# Patient Record
Sex: Female | Born: 1960
Health system: Southern US, Community
[De-identification: ages and names within clinical notes are randomized; demographics above are authoritative.]

## PROBLEM LIST (undated history)

## (undated) ENCOUNTER — Emergency Department (HOSPITAL_COMMUNITY): Payer: 59 | Source: Home / Self Care

## (undated) DIAGNOSIS — G43909 Migraine, unspecified, not intractable, without status migrainosus: Secondary | ICD-10-CM

## (undated) DIAGNOSIS — N959 Unspecified menopausal and perimenopausal disorder: Secondary | ICD-10-CM

## (undated) DIAGNOSIS — T7840XA Allergy, unspecified, initial encounter: Secondary | ICD-10-CM

## (undated) DIAGNOSIS — C50919 Malignant neoplasm of unspecified site of unspecified female breast: Secondary | ICD-10-CM

## (undated) HISTORY — PX: MASTECTOMY: SHX3

## (undated) HISTORY — DX: Allergy, unspecified, initial encounter: T78.40XA

## (undated) HISTORY — PX: OTHER SURGICAL HISTORY: SHX169

## (undated) HISTORY — PX: BREAST SURGERY: SHX581

---

## 1999-01-22 ENCOUNTER — Other Ambulatory Visit: Admission: RE | Admit: 1999-01-22 | Discharge: 1999-01-22 | Payer: Self-pay | Admitting: *Deleted

## 1999-10-26 ENCOUNTER — Ambulatory Visit (HOSPITAL_COMMUNITY): Admission: RE | Admit: 1999-10-26 | Discharge: 1999-10-26 | Payer: Self-pay | Admitting: *Deleted

## 1999-11-02 ENCOUNTER — Ambulatory Visit (HOSPITAL_COMMUNITY): Admission: RE | Admit: 1999-11-02 | Discharge: 1999-11-02 | Payer: Self-pay | Admitting: *Deleted

## 1999-11-02 ENCOUNTER — Encounter (INDEPENDENT_AMBULATORY_CARE_PROVIDER_SITE_OTHER): Payer: Self-pay

## 1999-11-19 ENCOUNTER — Inpatient Hospital Stay (HOSPITAL_COMMUNITY): Admission: RE | Admit: 1999-11-19 | Discharge: 1999-11-21 | Payer: Self-pay | Admitting: *Deleted

## 1999-12-06 ENCOUNTER — Ambulatory Visit (HOSPITAL_COMMUNITY): Admission: RE | Admit: 1999-12-06 | Discharge: 1999-12-06 | Payer: Self-pay | Admitting: *Deleted

## 1999-12-15 ENCOUNTER — Ambulatory Visit (HOSPITAL_COMMUNITY): Admission: RE | Admit: 1999-12-15 | Discharge: 1999-12-15 | Payer: Self-pay | Admitting: Oncology

## 1999-12-15 ENCOUNTER — Encounter: Payer: Self-pay | Admitting: Oncology

## 1999-12-15 ENCOUNTER — Encounter: Admission: RE | Admit: 1999-12-15 | Discharge: 2000-03-14 | Payer: Self-pay | Admitting: Radiation Oncology

## 1999-12-22 ENCOUNTER — Ambulatory Visit (HOSPITAL_COMMUNITY): Admission: RE | Admit: 1999-12-22 | Discharge: 1999-12-23 | Payer: Self-pay | Admitting: *Deleted

## 1999-12-22 ENCOUNTER — Encounter (INDEPENDENT_AMBULATORY_CARE_PROVIDER_SITE_OTHER): Payer: Self-pay | Admitting: *Deleted

## 1999-12-27 ENCOUNTER — Other Ambulatory Visit: Admission: RE | Admit: 1999-12-27 | Discharge: 1999-12-27 | Payer: Self-pay | Admitting: *Deleted

## 2000-03-22 ENCOUNTER — Encounter: Admission: RE | Admit: 2000-03-22 | Discharge: 2000-06-20 | Payer: Self-pay | Admitting: Radiation Oncology

## 2000-03-24 ENCOUNTER — Encounter: Payer: Self-pay | Admitting: Oncology

## 2000-03-24 ENCOUNTER — Ambulatory Visit (HOSPITAL_COMMUNITY): Admission: RE | Admit: 2000-03-24 | Discharge: 2000-03-24 | Payer: Self-pay | Admitting: Oncology

## 2000-05-10 ENCOUNTER — Ambulatory Visit (HOSPITAL_COMMUNITY): Admission: RE | Admit: 2000-05-10 | Discharge: 2000-05-10 | Payer: Self-pay | Admitting: Radiation Oncology

## 2000-10-06 ENCOUNTER — Ambulatory Visit (HOSPITAL_BASED_OUTPATIENT_CLINIC_OR_DEPARTMENT_OTHER): Admission: RE | Admit: 2000-10-06 | Discharge: 2000-10-06 | Payer: Self-pay | Admitting: *Deleted

## 2000-10-06 ENCOUNTER — Encounter (INDEPENDENT_AMBULATORY_CARE_PROVIDER_SITE_OTHER): Payer: Self-pay | Admitting: *Deleted

## 2001-01-25 ENCOUNTER — Encounter: Admission: RE | Admit: 2001-01-25 | Discharge: 2001-04-25 | Payer: Self-pay | Admitting: Oncology

## 2001-02-07 ENCOUNTER — Other Ambulatory Visit: Admission: RE | Admit: 2001-02-07 | Discharge: 2001-02-07 | Payer: Self-pay | Admitting: *Deleted

## 2001-02-09 ENCOUNTER — Ambulatory Visit (HOSPITAL_BASED_OUTPATIENT_CLINIC_OR_DEPARTMENT_OTHER): Admission: RE | Admit: 2001-02-09 | Discharge: 2001-02-09 | Payer: Self-pay | Admitting: Plastic Surgery

## 2001-07-31 ENCOUNTER — Ambulatory Visit (HOSPITAL_BASED_OUTPATIENT_CLINIC_OR_DEPARTMENT_OTHER): Admission: RE | Admit: 2001-07-31 | Discharge: 2001-07-31 | Payer: Self-pay | Admitting: *Deleted

## 2001-07-31 ENCOUNTER — Encounter (INDEPENDENT_AMBULATORY_CARE_PROVIDER_SITE_OTHER): Payer: Self-pay | Admitting: *Deleted

## 2001-10-23 ENCOUNTER — Ambulatory Visit (HOSPITAL_BASED_OUTPATIENT_CLINIC_OR_DEPARTMENT_OTHER): Admission: RE | Admit: 2001-10-23 | Discharge: 2001-10-23 | Payer: Self-pay | Admitting: Plastic Surgery

## 2002-01-15 ENCOUNTER — Ambulatory Visit (HOSPITAL_BASED_OUTPATIENT_CLINIC_OR_DEPARTMENT_OTHER): Admission: RE | Admit: 2002-01-15 | Discharge: 2002-01-15 | Payer: Self-pay | Admitting: Plastic Surgery

## 2002-02-08 ENCOUNTER — Other Ambulatory Visit: Admission: RE | Admit: 2002-02-08 | Discharge: 2002-02-08 | Payer: Self-pay | Admitting: *Deleted

## 2002-05-10 ENCOUNTER — Ambulatory Visit (HOSPITAL_COMMUNITY): Admission: RE | Admit: 2002-05-10 | Discharge: 2002-05-10 | Payer: Self-pay | Admitting: Oncology

## 2002-05-10 ENCOUNTER — Encounter: Payer: Self-pay | Admitting: Oncology

## 2002-07-23 ENCOUNTER — Encounter: Payer: Self-pay | Admitting: Plastic Surgery

## 2002-07-23 ENCOUNTER — Encounter: Admission: RE | Admit: 2002-07-23 | Discharge: 2002-07-23 | Payer: Self-pay | Admitting: Plastic Surgery

## 2003-02-21 ENCOUNTER — Other Ambulatory Visit: Admission: RE | Admit: 2003-02-21 | Discharge: 2003-02-21 | Payer: Self-pay | Admitting: *Deleted

## 2003-07-31 ENCOUNTER — Encounter: Admission: RE | Admit: 2003-07-31 | Discharge: 2003-07-31 | Payer: Self-pay | Admitting: Oncology

## 2003-10-22 ENCOUNTER — Emergency Department (HOSPITAL_COMMUNITY): Admission: EM | Admit: 2003-10-22 | Discharge: 2003-10-22 | Payer: Self-pay | Admitting: Emergency Medicine

## 2004-04-23 ENCOUNTER — Other Ambulatory Visit: Admission: RE | Admit: 2004-04-23 | Discharge: 2004-04-23 | Payer: Self-pay | Admitting: Obstetrics and Gynecology

## 2004-05-12 ENCOUNTER — Ambulatory Visit (HOSPITAL_COMMUNITY): Admission: RE | Admit: 2004-05-12 | Discharge: 2004-05-12 | Payer: Self-pay | Admitting: Oncology

## 2004-08-13 ENCOUNTER — Encounter: Admission: RE | Admit: 2004-08-13 | Discharge: 2004-08-13 | Payer: Self-pay | Admitting: Oncology

## 2004-08-22 DIAGNOSIS — C50919 Malignant neoplasm of unspecified site of unspecified female breast: Secondary | ICD-10-CM

## 2004-08-22 HISTORY — DX: Malignant neoplasm of unspecified site of unspecified female breast: C50.919

## 2004-08-27 ENCOUNTER — Ambulatory Visit: Payer: Self-pay | Admitting: Oncology

## 2004-12-04 ENCOUNTER — Ambulatory Visit (HOSPITAL_COMMUNITY): Admission: RE | Admit: 2004-12-04 | Discharge: 2004-12-04 | Payer: Self-pay | Admitting: Oncology

## 2005-02-28 ENCOUNTER — Ambulatory Visit: Payer: Self-pay | Admitting: Oncology

## 2005-06-07 ENCOUNTER — Other Ambulatory Visit: Admission: RE | Admit: 2005-06-07 | Discharge: 2005-06-07 | Payer: Self-pay | Admitting: Obstetrics and Gynecology

## 2005-08-18 ENCOUNTER — Encounter: Admission: RE | Admit: 2005-08-18 | Discharge: 2005-08-18 | Payer: Self-pay | Admitting: Obstetrics and Gynecology

## 2006-03-07 ENCOUNTER — Ambulatory Visit (HOSPITAL_COMMUNITY): Admission: RE | Admit: 2006-03-07 | Discharge: 2006-03-07 | Payer: Self-pay | Admitting: Oncology

## 2006-03-07 ENCOUNTER — Ambulatory Visit: Payer: Self-pay | Admitting: Oncology

## 2006-03-09 ENCOUNTER — Ambulatory Visit: Admission: RE | Admit: 2006-03-09 | Discharge: 2006-03-09 | Payer: Self-pay | Admitting: Oncology

## 2006-03-09 ENCOUNTER — Encounter (INDEPENDENT_AMBULATORY_CARE_PROVIDER_SITE_OTHER): Payer: Self-pay | Admitting: *Deleted

## 2006-04-03 ENCOUNTER — Encounter: Admission: RE | Admit: 2006-04-03 | Discharge: 2006-05-03 | Payer: Self-pay | Admitting: Oncology

## 2006-09-08 ENCOUNTER — Encounter: Admission: RE | Admit: 2006-09-08 | Discharge: 2006-09-08 | Payer: Self-pay | Admitting: Obstetrics and Gynecology

## 2006-11-06 ENCOUNTER — Encounter: Admission: RE | Admit: 2006-11-06 | Discharge: 2007-02-04 | Payer: Self-pay | Admitting: Oncology

## 2007-10-01 ENCOUNTER — Encounter: Admission: RE | Admit: 2007-10-01 | Discharge: 2007-12-03 | Payer: Self-pay | Admitting: Oncology

## 2007-11-23 ENCOUNTER — Encounter: Admission: RE | Admit: 2007-11-23 | Discharge: 2007-11-23 | Payer: Self-pay | Admitting: Oncology

## 2009-07-03 ENCOUNTER — Ambulatory Visit (HOSPITAL_COMMUNITY): Admission: RE | Admit: 2009-07-03 | Discharge: 2009-07-03 | Payer: Self-pay | Admitting: Unknown Physician Specialty

## 2009-08-31 ENCOUNTER — Encounter: Admission: RE | Admit: 2009-08-31 | Discharge: 2009-11-03 | Payer: Self-pay | Admitting: Oncology

## 2009-12-11 ENCOUNTER — Encounter: Admission: RE | Admit: 2009-12-11 | Discharge: 2009-12-11 | Payer: Self-pay | Admitting: Oncology

## 2010-09-11 ENCOUNTER — Encounter: Payer: Self-pay | Admitting: Oncology

## 2010-10-14 ENCOUNTER — Other Ambulatory Visit: Payer: Self-pay | Admitting: Obstetrics and Gynecology

## 2010-10-14 DIAGNOSIS — Z1231 Encounter for screening mammogram for malignant neoplasm of breast: Secondary | ICD-10-CM

## 2010-12-13 ENCOUNTER — Ambulatory Visit
Admission: RE | Admit: 2010-12-13 | Discharge: 2010-12-13 | Disposition: A | Discharge status: Home / Self Care | Payer: 59 | Source: Ambulatory Visit | Attending: Obstetrics and Gynecology | Admitting: Obstetrics and Gynecology

## 2010-12-13 DIAGNOSIS — Z1231 Encounter for screening mammogram for malignant neoplasm of breast: Secondary | ICD-10-CM

## 2011-01-07 NOTE — Op Note (Signed)
[place]  Patient:    Jodi Nunez, Jodi Nunez Visit Number: [id number] MRN: [id number]          Service Type: DSU Location: [facility] Attending Physician:  Loura Halt Ii Dictated by:   Alfredia Ferguson, M.D. Proc. Date: 01/15/02 Admit Date:  01/15/2002                             Operative Report  PREOPERATIVE DIAGNOSIS: 1. Breast cancer. 2. Acquired absence of bilateral breasts secondary to #1.  POSTOPERATIVE DIAGNOSIS: 1. Breast cancer. 2. Acquired absence of bilateral breasts secondary to #1.  OPERATION PERFORMED: 1. Right nipple reconstruction. 2. Left nipple vascular delay procedure.  SURGEON:  Alfredia Ferguson, M.D.  ANESTHESIA:  2% Xylocaine plain.  INDICATIONS FOR PROCEDURE:  The patient is a 50 year old woman who has had breast cancer.  She has had one breast removed for breast cancer and the other removed prophylactically.  She was reconstructed with bilateral buried TRAM flaps.  The patient is ready to proceed with nipple reconstruction.  She previously had a bilateral nipple vascular delay procedure to ensure viability of the nipple flaps.  She is brought to the operating room at this time to re-elevate these nipple flaps for creation of the nipple.  The risks of failure of the nipple flaps due to vascular compromise were discussed.  The possibility of infection, bleeding, asymmetry were also discussed.  The patient understands the possibility that I may have to elevate the nipple flap on the radiated side and then place it back down for a second vascular delay.  DESCRIPTION OF PROCEDURE:  Skin markers were placed on the previous scar for a double opposing tab technique for nipple reconstruction.  The bilateral reconstructed breasts were prepped and draped in a sterile fashion.  2% Xylocaine plain was infiltrated in the areas of both nipples.  Attention was first directed to the right side.  The split thickness skin flap was  elevated at the 12 oclock and 6 oclock position.  This was a square flap which was elevated for a distance of approximately 0.5 cm.  An S-shaped incision with a long axis of the S going through the transverse mastectomy scar was incised. The depth of the incision was into the subcutaneous tissues.  The previously elevated split thickness flap of skin was lifted and the dermis just at the edge of the flap was incised.  This allowed the skin flap to be connected to the double opposing tab flap which was going to be elevated as a unit.  The first limb of the S was elevated from a lateral to medial direction.  The second limb of the S was elevated from a medial to lateral direction.  They were elevated until such point that they had adequate height for nipple reconstruction.  Hemostasis was accomplished using pressure.  The donor site was now closed by approximating the dermis using interrupted 4-0 Monocryl. The donor site was closed in such a fashion as to bring the basis of these two flaps in closer proximity.  Several 4-0 Monocryl sutures were placed.  The two tabs of skin and fat which were now sticking perpendicular to the breast mound were folded together as two hands in prayer.  The tip of one flap was attached to the base of the second flap.  This process was repeated with the tip of the second flap to the base of the first  flap.  This created the appearance of a nipple.  The remainder of the flap incision was closed to each other using interrupted 4-0 chromic suture.  The two split thickness tabs of skin were wrapped around the base of this reconstructed nipple.  This closed the entire nipple reconstruction into a cylinder.  4-0 chromic sutures were again used to close off the base.  The vascularity of this flap appeared to be excellent at the conclusion of the procedure.  The donor site skin edges were closed using multiple interrupted 4-0 chromic suture.  Attention was then directed  to the left side where an identical procedure was performed.  After creation of the nipple on the left side, the nipple flaps appeared to be very white with absolutely no capillary refill.  I was concerned that this was the radiated side and it would be unlikely that circulation would be restored.  For this reason, I removed all sutures and replaced the two flaps back in their anatomic position.  The flaps slowly revascularized.  The split thickness skin graft tabs which were connected to my flaps were tacked back down again to their original site.  Both sides were now dressed with bulky dressing.  The patient tolerated the procedure well with minimal blood loss.  She was discharged home in satisfactory condition. Dictated by:   Alfredia Ferguson, M.D. Attending Physician:  Loura Halt Ii DD:  01/15/02 TD:  01/15/02 Job: [id number]

## 2011-11-20 ENCOUNTER — Observation Stay (HOSPITAL_COMMUNITY)
Admission: EM | Admit: 2011-11-20 | Discharge: 2011-11-21 | Disposition: A | Discharge status: Home / Self Care | Payer: 59 | Attending: Emergency Medicine | Admitting: Emergency Medicine

## 2011-11-20 ENCOUNTER — Encounter (HOSPITAL_COMMUNITY): Payer: Self-pay

## 2011-11-20 DIAGNOSIS — L03114 Cellulitis of left upper limb: Secondary | ICD-10-CM

## 2011-11-20 DIAGNOSIS — Z853 Personal history of malignant neoplasm of breast: Secondary | ICD-10-CM | POA: Insufficient documentation

## 2011-11-20 DIAGNOSIS — Z9010 Acquired absence of unspecified breast and nipple: Secondary | ICD-10-CM | POA: Insufficient documentation

## 2011-11-20 DIAGNOSIS — R509 Fever, unspecified: Secondary | ICD-10-CM | POA: Insufficient documentation

## 2011-11-20 DIAGNOSIS — IMO0002 Reserved for concepts with insufficient information to code with codable children: Principal | ICD-10-CM | POA: Insufficient documentation

## 2011-11-20 HISTORY — DX: Malignant neoplasm of unspecified site of unspecified female breast: C50.919

## 2011-11-20 HISTORY — DX: Unspecified menopausal and perimenopausal disorder: N95.9

## 2011-11-20 LAB — BASIC METABOLIC PANEL
BUN: 15 mg/dL (ref 6–23)
Calcium: 10.2 mg/dL (ref 8.4–10.5)
Creatinine, Ser: 0.67 mg/dL (ref 0.50–1.10)
GFR calc Af Amer: >90 mL/min (ref >90)
GFR calc non Af Amer: >90 mL/min (ref >90)
Potassium: 4 mEq/L (ref 3.5–5.1)

## 2011-11-20 LAB — CBC
HCT: 39.2 % (ref 36.0–46.0)
Hemoglobin: 13.4 g/dL (ref 12.0–15.0)
MCH: 31.4 pg (ref 26.0–34.0)
MCHC: 34.2 g/dL (ref 30.0–36.0)
MCV: 91.8 fL (ref 78.0–100.0)
Platelets: 258 K/uL (ref 150–400)
RBC: 4.27 MIL/uL (ref 3.87–5.11)
RDW: 12.5 % (ref 11.5–15.5)
WBC: 9.3 K/uL (ref 4.0–10.5)

## 2011-11-20 LAB — BASIC METABOLIC PANEL WITH GFR
CO2: 28 meq/L (ref 19–32)
Chloride: 101 meq/L (ref 96–112)
Glucose, Bld: 91 mg/dL (ref 70–99)
Sodium: 139 meq/L (ref 135–145)

## 2011-11-20 MED ORDER — ACETAMINOPHEN 325 MG PO TABS
650.0000 mg | ORAL_TABLET | ORAL | Status: DC | PRN
  Administered 2011-11-20: 650 mg via ORAL
  Filled 2011-11-20: qty 2

## 2011-11-20 MED ORDER — SODIUM CHLORIDE 0.9 % IV SOLN
20.0000 mL | INTRAVENOUS | Status: DC
  Administered 2011-11-20: 20 mL via INTRAVENOUS

## 2011-11-20 MED ORDER — CLINDAMYCIN PHOSPHATE 900 MG/50ML IV SOLN
900.0000 mg | Freq: Three times a day (TID) | INTRAVENOUS | Status: DC
  Administered 2011-11-20 – 2011-11-21 (×3): 900 mg via INTRAVENOUS
  Filled 2011-11-20 (×5): qty 50

## 2011-11-20 NOTE — ED Notes (Signed)
Cellulitis on left arm has decreased from earlier.

## 2011-11-20 NOTE — ED Provider Notes (Signed)
History     CSN: [id number]  Arrival date & time 11/20/11  1230   First MD Initiated Contact with Patient 11/20/11 1411      Chief Complaint  Patient presents with  . Joint Swelling    left arm swelling/hx of bialteral mastectomy    (Consider location/radiation/quality/duration/timing/severity/associated sxs/prior treatment) HPI Comments: Patient with significant history of breast cancer involving the left breast who had double mastectomy many years ago. She has been in remission has had no significant problems except for occasional lymphedema involving her left upper extremity which she can usually treated at home by herself with improvement. The patient reports yesterday she noticed a little worsening swelling than typical associated with some mild discomfort. She reports redness began to develop along her forearm associated with swelling as well as warmth. She reports last night she woke up do to chills and a fever of about 101. She denies any chest pain, URI symptoms, cough. She also denies any nausea vomiting or diarrhea. She did take some Tylenol for the fever last night. Otherwise she denies any numbness or weakness. She reports no cancer was ever found the right side and IVs are fine on the right side   The history is provided by the patient.    Past Medical History  Diagnosis Date  . Breast cancer   . Menopausal and perimenopausal disorder     Past Surgical History  Procedure Date  . Mastectomy   . Breast surgery   . Cesarean section     History reviewed. No pertinent family history.  History  Substance Use Topics  . Smoking status: Former Games developer  . Smokeless tobacco: Not on file  . Alcohol Use: No    OB History    Grav Para Term Preterm Abortions TAB SAB Ect Mult Living                  Review of Systems  Constitutional: Positive for fever.  HENT: Negative for congestion and rhinorrhea.   Respiratory: Negative for cough and shortness of breath.     Cardiovascular: Negative for chest pain.  Gastrointestinal: Negative for nausea, vomiting and diarrhea.  Musculoskeletal: Positive for arthralgias.  Skin: Positive for color change. Negative for rash and wound.  Neurological: Negative for headaches.  All other systems reviewed and are negative.    Allergies  Penicillins  Home Medications   Current Outpatient Rx  Name Route Sig Dispense Refill  . AMPHETAMINE-DEXTROAMPHETAMINE 20 MG PO TABS Oral Take 20 mg by mouth daily.    Marland Kitchen BIOTIN PO Oral Take 1 tablet by mouth daily.    Marland Kitchen CALCIUM PO Oral Take 1 tablet by mouth daily.    Marland Kitchen VITAMIN D PO Oral Take by mouth.    . ADULT MULTIVITAMIN W/MINERALS CH Oral Take 1 tablet by mouth daily.      BP 122/71  Pulse 104  Temp(Src) 98.2 F (36.8 C) (Oral)  Resp 20  SpO2 97%  Physical Exam  Nursing note and vitals reviewed. Constitutional: She appears well-developed and well-nourished. No distress.  HENT:  Head: Normocephalic.  Eyes: Pupils are equal, round, and reactive to light. No scleral icterus.  Cardiovascular: Normal rate.   Pulmonary/Chest: Effort normal.  Abdominal: Soft. There is no tenderness.  Musculoskeletal:       Left forearm: She exhibits tenderness, swelling and edema. She exhibits no bony tenderness and no deformity.       Erythema, warmth noted to diffuse left forearm.  ROM is intact  Skin: Skin is warm and dry. She is not diaphoretic.    ED Course  Procedures (including critical care time)   Labs Reviewed  CBC  BASIC METABOLIC PANEL   No results found.   1. Cellulitis of left forearm       MDM   Patient with fever at home and evidence of likely cellulitis involving her left forearm and extending a little bit into her upper extremity. Patient's risk factor is lymphedema secondary to mastectomy on the left side more than 10 years ago to 2 history of breast cancer. The patient is not toxic appearing. Patient I think would benefit from IV clindamycin,  several doses and we'll hold her in CDU observation status under cellulitis protocol. I discussed the patient with PAC Neva Seat.          Gavin Pound. Delainy Mcelhiney, MD 11/20/11 1432

## 2011-11-20 NOTE — ED Notes (Signed)
Pt states that a couple of days ago she noticed that she has some left arm swelling that has continued to get worse. She has hx of bilateral mastectomy and states that normally when she develops swelling in her arm she is able to complete some pt that she knows to aid in the swelling but last night she woke up with a fever of over 101 and now she has developed pain, splotching, and lymphedema in the left arm. Pt was at work this morning when she had one of the edp's look at her arm and she was told that she may need to come in for abx therapy. Pt is alert and oriented. No meds pta.

## 2011-11-20 NOTE — ED Provider Notes (Signed)
Assumed patient care at 1540. Pt w/ hx of left upper extremity lymphedema secondary to mastectomy from breast cancer 10 years ago presents with left  arm cellulitis. Patient has been evaluated by Dr. Oletta Lamas.  Currently receiving IV abx (clinda).  Plan to finish cellulitis protocol, and likely discharge tomorrow.   3:51 PM Normal white count.  Normal electrolytes. Area of cellulitis was marked and dated.  Pt currently in NAD, VSS.  No obvious joint pain on exam.  Will start IV clinda, and continue monitoring.  6:10 PM Mild improvement noted has erythema shows receding from marked line.  Will continue IV abx.  8:29 PM Subjectively the patient feels better. No significant changes to cellulitis however has not progressed past marked line. We'll continue with current management.  12:05 AM Dr. Juleen China will continue to monitor pt overnight.  Plan to discharge tomorrow with PO clindamycin and f/u in 24-48hrs for recheck.  Pt voice understanding.  Pt currently in NAD.  Cellulitis improves.    Fayrene Helper, PA-C 11/21/11 0006

## 2011-11-21 MED ORDER — ZOLPIDEM TARTRATE 5 MG PO TABS
5.0000 mg | ORAL_TABLET | Freq: Once | ORAL | Status: AC
  Administered 2011-11-21: 5 mg via ORAL
  Filled 2011-11-21: qty 1

## 2011-11-21 MED ORDER — CLINDAMYCIN HCL 300 MG PO CAPS: 300.0000 mg | ORAL_CAPSULE | Freq: Four times a day (QID) | ORAL | Status: AC

## 2011-11-21 NOTE — ED Provider Notes (Signed)
6:56 AM Patient was reassessed this morning. Erythema and swelling has improved per patient and when viewing outline of skin markings. Patient has received several doses of clindamycin the emergency room. She is a afebrile nontoxic. Feel that K. continued antibiotics as an outpatient. Patient instructed to return for recheck in 24-48 hours. Return precautions sooner were discussed.  Raeford Razor, MD 11/21/11 [phone]

## 2011-11-21 NOTE — ED Notes (Signed)
Called for breakfast tray.  

## 2011-11-21 NOTE — Discharge Instructions (Signed)
Cellulitis Cellulitis is an infection of the tissue under the skin. The infected area is usually red and tender. This is caused by germs. These germs enter the body through cuts or sores. This usually happens in the arms or lower legs. HOME CARE   Take your medicine as told. Finish it even if you start to feel better.   If the infection is on the arm or leg, keep it raised (elevated).   Use a warm cloth on the infected area several times a day.   See your doctor for a follow-up visit as told.  GET HELP RIGHT AWAY IF:   You are tired or confused.   You throw up (vomit).   You have watery poop (diarrhea).   You feel ill and have muscle aches.   You have a fever.  MAKE SURE YOU:   Understand these instructions.   Will watch your condition.   Will get help right away if you are not doing well or get worse.  Document Released: 01/25/2008 Document Revised: 07/28/2011 Document Reviewed: 07/10/2009 Exit[employer] Patient Information 2012 [employer]. 

## 2011-11-21 NOTE — ED Provider Notes (Signed)
Medical screening examination/treatment/procedure(s) were conducted as a shared visit with non-physician practitioner(s) and myself.  I personally evaluated the patient during the encounter  Please see my original H&P note.  Pt with arm cellultis on protocol.  Placed in CDU under observation.    Gavin Pound. Talon Regala, MD 11/21/11 1719

## 2011-11-21 NOTE — ED Notes (Signed)
Called pharmacy for Clindamycin.

## 2011-12-06 ENCOUNTER — Other Ambulatory Visit: Payer: Self-pay | Admitting: Obstetrics and Gynecology

## 2012-02-08 ENCOUNTER — Other Ambulatory Visit: Payer: Self-pay | Admitting: Obstetrics and Gynecology

## 2012-02-08 DIAGNOSIS — Z1231 Encounter for screening mammogram for malignant neoplasm of breast: Secondary | ICD-10-CM

## 2012-02-20 ENCOUNTER — Ambulatory Visit
Admission: RE | Admit: 2012-02-20 | Discharge: 2012-02-20 | Disposition: A | Discharge status: Home / Self Care | Payer: 59 | Source: Ambulatory Visit | Attending: Obstetrics and Gynecology | Admitting: Obstetrics and Gynecology

## 2012-02-20 DIAGNOSIS — Z1231 Encounter for screening mammogram for malignant neoplasm of breast: Secondary | ICD-10-CM

## 2012-12-10 ENCOUNTER — Other Ambulatory Visit: Payer: Self-pay | Admitting: Obstetrics and Gynecology

## 2013-06-27 ENCOUNTER — Other Ambulatory Visit: Payer: Self-pay | Admitting: *Deleted

## 2013-06-28 ENCOUNTER — Telehealth: Payer: Self-pay | Admitting: *Deleted

## 2013-06-28 NOTE — Telephone Encounter (Signed)
sw pt informed her that LM will give her a call for her genetics appt.i emailed LM.Marland Kitchentd

## 2013-07-05 ENCOUNTER — Telehealth: Payer: Self-pay | Admitting: *Deleted

## 2013-07-05 NOTE — Telephone Encounter (Signed)
Left message for pt to return my call so I can schedule a genetic appt.  

## 2013-07-08 ENCOUNTER — Telehealth: Payer: Self-pay | Admitting: *Deleted

## 2013-07-08 NOTE — Telephone Encounter (Signed)
Pt returned my call and I confirmed 09/02/13 genetic appt w/ pt.

## 2013-09-02 ENCOUNTER — Other Ambulatory Visit: Payer: 59

## 2013-09-02 ENCOUNTER — Inpatient Hospital Stay (HOSPITAL_BASED_OUTPATIENT_CLINIC_OR_DEPARTMENT_OTHER): Payer: 59 | Admitting: Genetic Counselor

## 2013-09-02 ENCOUNTER — Encounter: Payer: Self-pay | Admitting: Genetic Counselor

## 2013-09-02 DIAGNOSIS — Z800 Family history of malignant neoplasm of digestive organs: Secondary | ICD-10-CM

## 2013-09-02 DIAGNOSIS — C50919 Malignant neoplasm of unspecified site of unspecified female breast: Secondary | ICD-10-CM

## 2013-09-02 DIAGNOSIS — IMO0002 Reserved for concepts with insufficient information to code with codable children: Secondary | ICD-10-CM

## 2013-09-02 DIAGNOSIS — Z8051 Family history of malignant neoplasm of kidney: Secondary | ICD-10-CM

## 2013-09-02 DIAGNOSIS — Z8049 Family history of malignant neoplasm of other genital organs: Secondary | ICD-10-CM

## 2013-09-02 DIAGNOSIS — Z853 Personal history of malignant neoplasm of breast: Secondary | ICD-10-CM

## 2013-09-02 DIAGNOSIS — Z803 Family history of malignant neoplasm of breast: Secondary | ICD-10-CM

## 2013-09-02 NOTE — Progress Notes (Signed)
Dr.  Sarajane Jews Magrinat requested a consultation for genetic counseling and risk assessment for Jodi Nunez, a 53 y.o. female, for discussion of her personal history of breast cancer and family history of renal, uterine, breast and esophageal cancer.  She presents to clinic today to discuss the possibility of a genetic predisposition to cancer, and to further clarify her risks, as well as her family members' risks for cancer.   HISTORY OF PRESENT ILLNESS: In 2003, at the age of 38, Jodi Nunez was diagnosed with invasive ductal carcinoma of the brest. This was treated with bilateral mastectomy with TRAM, chemotherapy and radiation.  The tumor was ER-/PR-/Her2+.  She has had a colonoscopy and endoscopy which were negative.   Past Medical History  Diagnosis Date  . Menopausal and perimenopausal disorder   . Breast cancer 2006    ER-/PR-/her2+    Past Surgical History  Procedure Laterality Date  . Mastectomy    . Breast surgery    . Cesarean section      History   Social History  . Marital Status: Married    Spouse Name: N/A    Number of Children: N/A  . Years of Education: N/A   Social History Main Topics  . Smoking status: Former Smoker -- 0.20 packs/day for 10 years    Types: Cigarettes  . Smokeless tobacco: None  . Alcohol Use: Yes     Comment: 8 glasses of wine/week  . Drug Use: No  . Sexual Activity: None   Other Topics Concern  . None   Social History Narrative  . None    REPRODUCTIVE HISTORY AND PERSONAL RISK ASSESSMENT FACTORS: Menarche was at age 68-18.   postmenopausal Uterus Intact: yes Ovaries Intact: yes G1P1A0, first live birth at age 31  She has not previously undergone treatment for infertility.   Oral Contraceptive use: 5 years   She has used HRT in the past.    FAMILY HISTORY:  We obtained a detailed, 4-generation family history.  Significant diagnoses are listed below: Family History  Problem Relation Age of Onset  . Renal cancer  Mother 36  . Esophageal cancer Father 63  . Breast cancer Paternal Aunt     dx in her 53s  . Uterine cancer Maternal Grandmother 85    Patient's maternal ancestors are of unknown descent, and paternal ancestors are of [place] descent. There is no reported Ashkenazi Jewish ancestry. There is no known consanguinity.  GENETIC COUNSELING ASSESSMENT: Jodi Nunez is a 53 y.o. female with a personal history of bresat cancer and family history of renal, uterine, breast and esophageal cancer which somewhat suggestive of a hereditary breast cancer syndrome and predisposition to cancer. We, therefore, discussed and recommended the following at today's visit.   DISCUSSION: We reviewed the characteristics, features and inheritance patterns of hereditary cancer syndromes. We also discussed genetic testing, including the appropriate family members to test, the process of testing, insurance coverage and turn-around-time for results. We discussed genes associated with an increased risk for early onset breast cancer, including BRCA, PTEN, STK11, CDH1, TP53 and PALB2.  Based on her personal and family history we reviewed more carefully BRCA and PTEN mutations,.  The patient is concerned, now that she is starting through menopause, about her risk for uterine and ovarian cancer.  PLAN: After considering the risks, benefits, and limitations, Jodi Nunez provided informed consent to pursue genetic testing and the blood sample will be sent to [facility] for analysis  of the Brest/Ovarian cancer panel. We discussed the implications of a positive, negative and/ or variant of uncertain significance genetic test result. Results should be available within approximately 3 weeks' time, at which point they will be disclosed by telephone to Jodi Nunez, as will any additional recommendations warranted by these results. Jodi Nunez will receive a summary of her genetic counseling visit and a copy of  her results once available. This information will also be available in Epic. We encouraged Jodi Nunez to remain in contact with cancer genetics annually so that we can continuously update the family history and inform her of any changes in cancer genetics and testing that may be of benefit for her family. Jodi Nunez's questions were answered to her satisfaction today. Our contact information was provided should additional questions or concerns arise.  The patient was seen for a total of 60 minutes, greater than 50% of which was spent face-to-face counseling.  This note will also be sent to the referring provider via the electronic medical record. The patient will be supplied with a summary of this genetic counseling discussion as well as educational information on the discussed hereditary cancer syndromes following the conclusion of their visit.   Patient was discussed with Dr. Marcy Panning.   _______________________________________________________________________ For Office Staff:  Number of people involved in session: 1 Was an Intern/ student involved with case: yes

## 2013-09-17 ENCOUNTER — Telehealth: Payer: Self-pay | Admitting: Genetic Counselor

## 2013-09-17 ENCOUNTER — Encounter: Payer: Self-pay | Admitting: Genetic Counselor

## 2013-09-17 NOTE — Telephone Encounter (Signed)
Revealed negative genetic test results 

## 2013-09-17 NOTE — Telephone Encounter (Signed)
Left good news message on VM.  Asked that she call back. 

## 2014-01-04 ENCOUNTER — Ambulatory Visit (INDEPENDENT_AMBULATORY_CARE_PROVIDER_SITE_OTHER): Payer: 59 | Admitting: Family Medicine

## 2014-01-04 VITALS — BP 98/66 | HR 69 | Temp 97.6°F | Resp 18 | Ht 63.0 in | Wt 144.0 lb

## 2014-01-04 DIAGNOSIS — L739 Follicular disorder, unspecified: Secondary | ICD-10-CM

## 2014-01-04 DIAGNOSIS — J029 Acute pharyngitis, unspecified: Secondary | ICD-10-CM

## 2014-01-04 DIAGNOSIS — L738 Other specified follicular disorders: Secondary | ICD-10-CM

## 2014-01-04 DIAGNOSIS — R21 Rash and other nonspecific skin eruption: Secondary | ICD-10-CM

## 2014-01-04 DIAGNOSIS — L678 Other hair color and hair shaft abnormalities: Secondary | ICD-10-CM

## 2014-01-04 LAB — POCT RAPID STREP A (OFFICE): RAPID STREP A SCREEN: NEGATIVE

## 2014-01-04 MED ORDER — DOXYCYCLINE HYCLATE 100 MG PO TABS: 100.0000 mg | ORAL_TABLET | Freq: Two times a day (BID) | ORAL | Status: DC

## 2014-01-04 NOTE — Patient Instructions (Addendum)
If rash not continuing to improve tomorrow - can start antibiotic for folliculitis.   Upper Respiratory Infection, Adult An upper respiratory infection (URI) is also sometimes known as the common cold. The upper respiratory tract includes the nose, sinuses, throat, trachea, and bronchi. Bronchi are the airways leading to the lungs. Most people improve within 1 week, but symptoms can last up to 2 weeks. A residual cough may last even longer.  CAUSES Many different viruses can infect the tissues lining the upper respiratory tract. The tissues become irritated and inflamed and often become very moist. Mucus production is also common. A cold is contagious. You can easily spread the virus to others by oral contact. This includes kissing, sharing a glass, coughing, or sneezing. Touching your mouth or nose and then touching a surface, which is then touched by another person, can also spread the virus. SYMPTOMS  Symptoms typically develop 1 to 3 days after you come in contact with a cold virus. Symptoms vary from person to person. They may include:  Runny nose.  Sneezing.  Nasal congestion.  Sinus irritation.  Sore throat.  Loss of voice (laryngitis).  Cough.  Fatigue.  Muscle aches.  Loss of appetite.  Headache.  Low-grade fever. DIAGNOSIS  You might diagnose your own cold based on familiar symptoms, since most people get a cold 2 to 3 times a year. Your caregiver can confirm this based on your exam. Most importantly, your caregiver can check that your symptoms are not due to another disease such as strep throat, sinusitis, pneumonia, asthma, or epiglottitis. Blood tests, throat tests, and X-rays are not necessary to diagnose a common cold, but they may sometimes be helpful in excluding other more serious diseases. Your caregiver will decide if any further tests are required. RISKS AND COMPLICATIONS  You may be at risk for a more severe case of the common cold if you smoke cigarettes,  have chronic heart disease (such as heart failure) or lung disease (such as asthma), or if you have a weakened immune system. The very young and very old are also at risk for more serious infections. Bacterial sinusitis, middle ear infections, and bacterial pneumonia can complicate the common cold. The common cold can worsen asthma and chronic obstructive pulmonary disease (COPD). Sometimes, these complications can require emergency medical care and may be life-threatening. PREVENTION  The best way to protect against getting a cold is to practice good hygiene. Avoid oral or hand contact with people with cold symptoms. Wash your hands often if contact occurs. There is no clear evidence that vitamin C, vitamin E, echinacea, or exercise reduces the chance of developing a cold. However, it is always recommended to get plenty of rest and practice good nutrition. TREATMENT  Treatment is directed at relieving symptoms. There is no cure. Antibiotics are not effective, because the infection is caused by a virus, not by bacteria. Treatment may include:  Increased fluid intake. Sports drinks offer valuable electrolytes, sugars, and fluids.  Breathing heated mist or steam (vaporizer or shower).  Eating chicken soup or other clear broths, and maintaining good nutrition.  Getting plenty of rest.  Using gargles or lozenges for comfort.  Controlling fevers with ibuprofen or acetaminophen as directed by your caregiver.  Increasing usage of your inhaler if you have asthma. Zinc gel and zinc lozenges, taken in the first 24 hours of the common cold, can shorten the duration and lessen the severity of symptoms. Pain medicines may help with fever, muscle aches, and throat pain.  A variety of non-prescription medicines are available to treat congestion and runny nose. Your caregiver can make recommendations and may suggest nasal or lung inhalers for other symptoms.  HOME CARE INSTRUCTIONS   Only take over-the-counter  or prescription medicines for pain, discomfort, or fever as directed by your caregiver.  Use a warm mist humidifier or inhale steam from a shower to increase air moisture. This may keep secretions moist and make it easier to breathe.  Drink enough water and fluids to keep your urine clear or pale yellow.  Rest as needed.  Return to work when your temperature has returned to normal or as your caregiver advises. You may need to stay home longer to avoid infecting others. You can also use a face mask and careful hand washing to prevent spread of the virus. SEEK MEDICAL CARE IF:   After the first few days, you feel you are getting worse rather than better.  You need your caregiver's advice about medicines to control symptoms.  You develop chills, worsening shortness of breath, or brown or red sputum. These may be signs of pneumonia.  You develop yellow or brown nasal discharge or pain in the face, especially when you bend forward. These may be signs of sinusitis.  You develop a fever, swollen neck glands, pain with swallowing, or white areas in the back of your throat. These may be signs of strep throat. SEEK IMMEDIATE MEDICAL CARE IF:   You have a fever.  You develop severe or persistent headache, ear pain, sinus pain, or chest pain.  You develop wheezing, a prolonged cough, cough up blood, or have a change in your usual mucus (if you have chronic lung disease).  You develop sore muscles or a stiff neck. Document Released: 02/01/2001 Document Revised: 10/31/2011 Document Reviewed: 12/10/2010 [facility] Patient Information 2014 [place], [place]. Sore Throat A sore throat is pain, burning, irritation, or scratchiness of the throat. There is often pain or tenderness when swallowing or talking. A sore throat may be accompanied by other symptoms, such as coughing, sneezing, fever, and swollen neck glands. A sore throat is often the first sign of another sickness, such as a cold, flu, strep  throat, or mononucleosis (commonly known as mono). Most sore throats go away without medical treatment. CAUSES  The most common causes of a sore throat include:  A viral infection, such as a cold, flu, or mono.  A bacterial infection, such as strep throat, tonsillitis, or whooping cough.  Seasonal allergies.  Dryness in the air.  Irritants, such as smoke or pollution.  Gastroesophageal reflux disease (GERD). HOME CARE INSTRUCTIONS   Only take over-the-counter medicines as directed by your caregiver.  Drink enough fluids to keep your urine clear or pale yellow.  Rest as needed.  Try using throat sprays, lozenges, or sucking on hard candy to ease any pain (if older than 4 years or as directed).  Sip warm liquids, such as broth, herbal tea, or warm water with honey to relieve pain temporarily. You may also eat or drink cold or frozen liquids such as frozen ice pops.  Gargle with salt water (mix 1 tsp salt with 8 oz of water).  Do not smoke and avoid secondhand smoke.  Put a cool-mist humidifier in your bedroom at night to moisten the air. You can also turn on a hot shower and sit in the bathroom with the door closed for 5 10 minutes. SEEK IMMEDIATE MEDICAL CARE IF:  You have difficulty breathing.  You are unable  to swallow fluids, soft foods, or your saliva.  You have increased swelling in the throat.  Your sore throat does not get better in 7 days.  You have nausea and vomiting.  You have a fever or persistent symptoms for more than 2 3 days.  You have a fever and your symptoms suddenly get worse. MAKE SURE YOU:   Understand these instructions.  Will watch your condition.  Will get help right away if you are not doing well or get worse. Document Released: 09/15/2004 Document Revised: 07/25/2012 Document Reviewed: 04/15/2012 [facility] Patient Information 2014 [place], [place].

## 2014-01-04 NOTE — Progress Notes (Signed)
Subjective:  This chart was scribed for [employer]. Carlota Raspberry MD,   by Stacy Gardner, [facility] Scribe. The patient was seen in room and the patient's care was started at 6:56 PM.  Patient ID: Jodi Nunez, female    DOB: Dec 27, 1960, 53 y.o.   MRN: [id number]   Chief Complaint  Patient presents with  . Sore Throat    x4 days   . Otalgia  . sinus drainage  . Rash    arms noticed today    Sore Throat  Associated symptoms include congestion, coughing and ear pain.  Otalgia  Associated symptoms include coughing, a rash and a sore throat.  Rash Associated symptoms include congestion, coughing and a sore throat. Pertinent negatives include no fever.   HPI Comments: Jodi Nunez is a 53 y.o. female who arrives to the [facility] complaining of sore throat, onset four days. The pain initially felt like  "swallowing razor blades". She has the associated symptoms of irritated  L ear pain, nasal congestion, sinus pain,  white patches to the back of her throat initially, and cough with post nasal drip. She had a low grade fever (99.6-100.0) and it resolved the next morning. She tried [employer] which did not relief her symptoms.  Pt has a rash to her left forearm that was worse last night than it is today. Pt  uses a compression sock on her arm for edema, had bilateral mastectomy and L lymph node resection for breast cancer in past. She has hx of cellulitis in that arm. Pt is currently undergoing menopause and having trouble sleeping. Denies poison ivy contact.   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    Review of Systems  Constitutional: Negative for fever.  HENT: Positive for congestion, ear pain, postnasal drip, sinus pressure (right sided), sneezing and sore throat.   Respiratory: Positive for cough.   Skin: Positive for rash.  Hematological: Positive for adenopathy.  Psychiatric/Behavioral: Positive for sleep disturbance.       Objective:   Physical Exam  Nursing note and vitals reviewed. Constitutional: She is oriented to person, place, and time. She appears well-developed and well-nourished. No distress.  HENT:  Head: Normocephalic and atraumatic.  Right Ear: Hearing, tympanic membrane, external ear and ear canal normal.  Left Ear: Hearing, tympanic membrane, external ear and ear canal normal.  Nose: Nose normal.  Mouth/Throat: Oropharynx is clear and moist. No oropharyngeal exudate.  Minimum maxillary sinus tenderness otherwise non tender.   Eyes: Conjunctivae and EOM are normal. Pupils are equal, round, and reactive to light.  Cardiovascular: Normal rate, regular rhythm, normal heart sounds and intact distal pulses.  Exam reveals no gallop and no friction rub.   No murmur heard. Pulmonary/Chest: Effort normal and breath sounds normal. No respiratory distress. She has no wheezes. She has no rhonchi.  Neurological: She is alert and oriented to person, place, and time.  Skin: Skin is warm and dry. Rash noted. There is erythema.  Multiple small erythematous papular rashes on the left arm and lower aspect of upper arm. Interdigital spaces are spared. Sparing of the palm.   Epitrochlear or maxillary edema.   Psychiatric: She has a normal mood and affect. Her behavior is normal.   Results for orders placed in visit on 01/04/14  POCT RAPID STREP A (OFFICE)      Result Value Ref Range   Rapid Strep A Screen Negative  Negative  Assessment & Plan:   GENIEVE SONNIER is a 53 y.o. female Rash - Plan: POCT rapid strep A Folliculitis - Plan: doxycycline (VIBRA-TABS) 100 MG tablet  - DDX early folliculitis, but symptomatically improved today. Hx of lymph node resection L arm, and prior cellulitis. Rx for doxycycline given to start tomorrow if not continuing to improve. rtc precautions.   Sore throat - Plan: POCT rapid strep A normal.  Suspect viral pharyngitis/URI. Sx care, rtc precautions.    Meds ordered this encounter    Medications  . doxycycline (VIBRA-TABS) 100 MG tablet    Sig: Take 1 tablet (100 mg total) by mouth 2 (two) times daily.    Dispense:  20 tablet    Refill:  0   Patient Instructions  If rash not continuing to improve tomorrow - can start antibiotic for folliculitis.   Upper Respiratory Infection, Adult An upper respiratory infection (URI) is also sometimes known as the common cold. The upper respiratory tract includes the nose, sinuses, throat, trachea, and bronchi. Bronchi are the airways leading to the lungs. Most people improve within 1 week, but symptoms can last up to 2 weeks. A residual cough may last even longer.  CAUSES Many different viruses can infect the tissues lining the upper respiratory tract. The tissues become irritated and inflamed and often become very moist. Mucus production is also common. A cold is contagious. You can easily spread the virus to others by oral contact. This includes kissing, sharing a glass, coughing, or sneezing. Touching your mouth or nose and then touching a surface, which is then touched by another person, can also spread the virus. SYMPTOMS  Symptoms typically develop 1 to 3 days after you come in contact with a cold virus. Symptoms vary from person to person. They may include:  Runny nose.  Sneezing.  Nasal congestion.  Sinus irritation.  Sore throat.  Loss of voice (laryngitis).  Cough.  Fatigue.  Muscle aches.  Loss of appetite.  Headache.  Low-grade fever. DIAGNOSIS  You might diagnose your own cold based on familiar symptoms, since most people get a cold 2 to 3 times a year. Your caregiver can confirm this based on your exam. Most importantly, your caregiver can check that your symptoms are not due to another disease such as strep throat, sinusitis, pneumonia, asthma, or epiglottitis. Blood tests, throat tests, and X-rays are not necessary to diagnose a common cold, but they may sometimes be helpful in excluding other more  serious diseases. Your caregiver will decide if any further tests are required. RISKS AND COMPLICATIONS  You may be at risk for a more severe case of the common cold if you smoke cigarettes, have chronic heart disease (such as heart failure) or lung disease (such as asthma), or if you have a weakened immune system. The very young and very old are also at risk for more serious infections. Bacterial sinusitis, middle ear infections, and bacterial pneumonia can complicate the common cold. The common cold can worsen asthma and chronic obstructive pulmonary disease (COPD). Sometimes, these complications can require emergency medical care and may be life-threatening. PREVENTION  The best way to protect against getting a cold is to practice good hygiene. Avoid oral or hand contact with people with cold symptoms. Wash your hands often if contact occurs. There is no clear evidence that vitamin C, vitamin E, echinacea, or exercise reduces the chance of developing a cold. However, it is always recommended to get plenty of rest and practice good nutrition. TREATMENT  Treatment is directed at relieving symptoms. There is no cure. Antibiotics are not effective, because the infection is caused by a virus, not by bacteria. Treatment may include:  Increased fluid intake. Sports drinks offer valuable electrolytes, sugars, and fluids.  Breathing heated mist or steam (vaporizer or shower).  Eating chicken soup or other clear broths, and maintaining good nutrition.  Getting plenty of rest.  Using gargles or lozenges for comfort.  Controlling fevers with ibuprofen or acetaminophen as directed by your caregiver.  Increasing usage of your inhaler if you have asthma. Zinc gel and zinc lozenges, taken in the first 24 hours of the common cold, can shorten the duration and lessen the severity of symptoms. Pain medicines may help with fever, muscle aches, and throat pain. A variety of non-prescription medicines are  available to treat congestion and runny nose. Your caregiver can make recommendations and may suggest nasal or lung inhalers for other symptoms.  HOME CARE INSTRUCTIONS   Only take over-the-counter or prescription medicines for pain, discomfort, or fever as directed by your caregiver.  Use a warm mist humidifier or inhale steam from a shower to increase air moisture. This may keep secretions moist and make it easier to breathe.  Drink enough water and fluids to keep your urine clear or pale yellow.  Rest as needed.  Return to work when your temperature has returned to normal or as your caregiver advises. You may need to stay home longer to avoid infecting others. You can also use a face mask and careful hand washing to prevent spread of the virus. SEEK MEDICAL CARE IF:   After the first few days, you feel you are getting worse rather than better.  You need your caregiver's advice about medicines to control symptoms.  You develop chills, worsening shortness of breath, or brown or red sputum. These may be signs of pneumonia.  You develop yellow or brown nasal discharge or pain in the face, especially when you bend forward. These may be signs of sinusitis.  You develop a fever, swollen neck glands, pain with swallowing, or white areas in the back of your throat. These may be signs of strep throat. SEEK IMMEDIATE MEDICAL CARE IF:   You have a fever.  You develop severe or persistent headache, ear pain, sinus pain, or chest pain.  You develop wheezing, a prolonged cough, cough up blood, or have a change in your usual mucus (if you have chronic lung disease).  You develop sore muscles or a stiff neck. Document Released: 02/01/2001 Document Revised: 10/31/2011 Document Reviewed: 12/10/2010 [facility] Patient Information 2014 [place], [place]. Sore Throat A sore throat is pain, burning, irritation, or scratchiness of the throat. There is often pain or tenderness when swallowing or talking. A  sore throat may be accompanied by other symptoms, such as coughing, sneezing, fever, and swollen neck glands. A sore throat is often the first sign of another sickness, such as a cold, flu, strep throat, or mononucleosis (commonly known as mono). Most sore throats go away without medical treatment. CAUSES  The most common causes of a sore throat include:  A viral infection, such as a cold, flu, or mono.  A bacterial infection, such as strep throat, tonsillitis, or whooping cough.  Seasonal allergies.  Dryness in the air.  Irritants, such as smoke or pollution.  Gastroesophageal reflux disease (GERD). HOME CARE INSTRUCTIONS   Only take over-the-counter medicines as directed by your caregiver.  Drink enough fluids to keep your urine clear or pale yellow.  Rest as needed.  Try using throat sprays, lozenges, or sucking on hard candy to ease any pain (if older than 4 years or as directed).  Sip warm liquids, such as broth, herbal tea, or warm water with honey to relieve pain temporarily. You may also eat or drink cold or frozen liquids such as frozen ice pops.  Gargle with salt water (mix 1 tsp salt with 8 oz of water).  Do not smoke and avoid secondhand smoke.  Put a cool-mist humidifier in your bedroom at night to moisten the air. You can also turn on a hot shower and sit in the bathroom with the door closed for 5 10 minutes. SEEK IMMEDIATE MEDICAL CARE IF:  You have difficulty breathing.  You are unable to swallow fluids, soft foods, or your saliva.  You have increased swelling in the throat.  Your sore throat does not get better in 7 days.  You have nausea and vomiting.  You have a fever or persistent symptoms for more than 2 3 days.  You have a fever and your symptoms suddenly get worse. MAKE SURE YOU:   Understand these instructions.  Will watch your condition.  Will get help right away if you are not doing well or get worse. Document Released: 09/15/2004  Document Revised: 07/25/2012 Document Reviewed: 04/15/2012 [facility] Patient Information 2014 [place], [place].      I personally performed the services described in this documentation, which was scribed in my presence. The recorded information has been reviewed and considered, and addended by me as needed.

## 2014-01-24 ENCOUNTER — Other Ambulatory Visit: Payer: Self-pay | Admitting: Obstetrics and Gynecology

## 2014-01-27 LAB — CYTOLOGY - PAP

## 2014-03-03 ENCOUNTER — Emergency Department (HOSPITAL_COMMUNITY)
Admission: EM | Admit: 2014-03-03 | Discharge: 2014-03-03 | Disposition: A | Discharge status: Home / Self Care | Payer: 59 | Attending: Emergency Medicine | Admitting: Emergency Medicine

## 2014-03-03 ENCOUNTER — Emergency Department (HOSPITAL_COMMUNITY): Payer: 59

## 2014-03-03 ENCOUNTER — Encounter (HOSPITAL_COMMUNITY): Payer: Self-pay | Admitting: Emergency Medicine

## 2014-03-03 DIAGNOSIS — H53149 Visual discomfort, unspecified: Secondary | ICD-10-CM | POA: Insufficient documentation

## 2014-03-03 DIAGNOSIS — Z79899 Other long term (current) drug therapy: Secondary | ICD-10-CM | POA: Insufficient documentation

## 2014-03-03 DIAGNOSIS — R51 Headache: Secondary | ICD-10-CM

## 2014-03-03 DIAGNOSIS — Z880 Allergy status to penicillin: Secondary | ICD-10-CM | POA: Insufficient documentation

## 2014-03-03 DIAGNOSIS — Z87891 Personal history of nicotine dependence: Secondary | ICD-10-CM | POA: Insufficient documentation

## 2014-03-03 DIAGNOSIS — Z853 Personal history of malignant neoplasm of breast: Secondary | ICD-10-CM | POA: Insufficient documentation

## 2014-03-03 DIAGNOSIS — Z8742 Personal history of other diseases of the female genital tract: Secondary | ICD-10-CM | POA: Insufficient documentation

## 2014-03-03 DIAGNOSIS — R519 Headache, unspecified: Secondary | ICD-10-CM

## 2014-03-03 DIAGNOSIS — J0100 Acute maxillary sinusitis, unspecified: Secondary | ICD-10-CM | POA: Insufficient documentation

## 2014-03-03 MED ORDER — FENTANYL CITRATE 0.05 MG/ML IJ SOLN
100.0000 ug | Freq: Once | INTRAMUSCULAR | Status: AC
  Administered 2014-03-03: 100 ug via INTRAVENOUS
  Filled 2014-03-03: qty 2

## 2014-03-03 MED ORDER — METOCLOPRAMIDE HCL 5 MG/ML IJ SOLN
10.0000 mg | Freq: Once | INTRAMUSCULAR | Status: AC
  Administered 2014-03-03: 10 mg via INTRAVENOUS
  Filled 2014-03-03: qty 2

## 2014-03-03 MED ORDER — DOXYCYCLINE HYCLATE 100 MG PO TABS
100.0000 mg | ORAL_TABLET | Freq: Once | ORAL | Status: AC
  Administered 2014-03-03: 100 mg via ORAL
  Filled 2014-03-03: qty 1

## 2014-03-03 MED ORDER — DOXYCYCLINE HYCLATE 100 MG PO CAPS: 100.0000 mg | ORAL_CAPSULE | Freq: Two times a day (BID) | ORAL | Status: DC

## 2014-03-03 MED ORDER — HYDROCODONE-ACETAMINOPHEN 5-325 MG PO TABS: 1.0000 | ORAL_TABLET | Freq: Four times a day (QID) | ORAL | Status: DC | PRN

## 2014-03-03 MED ORDER — DIPHENHYDRAMINE HCL 50 MG/ML IJ SOLN
25.0000 mg | Freq: Once | INTRAMUSCULAR | Status: AC
  Administered 2014-03-03: 25 mg via INTRAVENOUS
  Filled 2014-03-03: qty 1

## 2014-03-03 NOTE — ED Notes (Signed)
Pt presents with HA, pt states she traveled by plan from [facility] today, upon return began having severe facial and frontal ha pain. +nausea. Pt states this feels different that typical migraine.

## 2014-03-03 NOTE — ED Provider Notes (Signed)
CSN: [id number]     Arrival date & time 03/03/14  0251 History   First MD Initiated Contact with Patient 03/03/14 0409     Chief Complaint  Patient presents with  . Headache     (Consider location/radiation/quality/duration/timing/severity/associated sxs/prior Treatment) HPI This is a 53 year old female with a history of migraines. She was working in the yard recently and developed some mild sinus congestion and throat irritation. She flew home yesterday evening and developed left-sided frontal and maxillary pain that has worsened subsequently. Her pain is now severe. It is not like pain associated with her usual migraines. There is associated nausea and photophobia. She has not been vomiting. There is no focal neurologic deficit. She is not having any breathing difficulty. She took her Maxalt, which usually resolves her migraines, without relief. She also has some poison oak to her left posterior leg that has been present for several days. It has not resolved with over-the-counter hydrocortisone and topical Benadryl.   Past Medical History  Diagnosis Date  . Menopausal and perimenopausal disorder   . Breast cancer 2006    ER-/PR-/her2+  . Allergy    Past Surgical History  Procedure Laterality Date  . Mastectomy    . Breast surgery    . Cesarean section     Family History  Problem Relation Age of Onset  . Renal cancer Mother 31  . Esophageal cancer Father 21  . Heart disease Father   . Breast cancer Paternal Aunt     dx in her 91s  . Uterine cancer Maternal Grandmother 80   History  Substance Use Topics  . Smoking status: Former Smoker -- 0.20 packs/day for 10 years    Types: Cigarettes  . Smokeless tobacco: Not on file  . Alcohol Use: Yes     Comment: 8 glasses of wine/week   OB History   Grav Para Term Preterm Abortions TAB SAB Ect Mult Living                 Review of Systems  All other systems reviewed and are negative.   Allergies  Penicillins  Home  Medications   Prior to Admission medications   Medication Sig Start Date End Date Taking? Authorizing Provider  amphetamine-dextroamphetamine (ADDERALL) 20 MG tablet Take 20 mg by mouth daily.   Yes Historical Provider, MD  BIOTIN PO Take 1 tablet by mouth daily.   Yes Historical Provider, MD  CALCIUM PO Take 1 tablet by mouth daily.   Yes Historical Provider, MD  Cholecalciferol (VITAMIN D PO) Take by mouth.   Yes Historical Provider, MD  Multiple Vitamin (MULITIVITAMIN WITH MINERALS) TABS Take 1 tablet by mouth daily.   Yes Historical Provider, MD   BP 105/71  Pulse 85  Temp(Src) 98.7 F (37.1 C) (Oral)  Resp 20  SpO2 100%  Physical Exam General: Well-developed, well-nourished female in no acute distress; appearance consistent with age of record HENT: normocephalic; atraumatic; left frontal and maxillary sinuses tender to percussion; TMs normal Eyes: pupils equal, round and reactive to light; extraocular muscles intact; photophobia Neck: supple Heart: regular rate and rhythm Lungs: clear to auscultation bilaterally Abdomen: soft; nondistended; nontender; no masses or hepatosplenomegaly; bowel sounds present Extremities: No deformity; full range of motion; pulses normal Neurologic: Awake, alert and oriented; motor function intact in all extremities and symmetric; no facial droop Skin: Warm and dry Psychiatric: Normal mood and affect   ED Course  Procedures (including critical care time)  MDM  Nursing notes and vitals  signs, including pulse oximetry, reviewed.  Summary of this visit's results, reviewed by myself:  Labs:  No results found for this or any previous visit (from the past 24 hour(s)).  Imaging Studies: [place] [facility]  03/03/2014   CLINICAL DATA:  Frontal headache, facial pain, history of breast cancer.  EXAM: CT PARANASAL SINUS WITHOUT CONTRAST  TECHNIQUE: Multidetector CT images of the paranasal sinuses were obtained using the standard protocol  without intravenous contrast.  COMPARISON:  Paranasal sinus CT July 03, 2009  FINDINGS: Mild left maxillary mucosal thickening, with small left maxillary mucosal retention cyst. Right maxillary sinus, bilateral sphenoid, ethmoid and frontal sinuses are well aerated.  Nasal septum deviated to the right. Soft tissue partially effaces the left ostiomeatal unit, the right is patent, sphenoid ethmoidal and frontoethmoidal recesses are widely patent.  No destructive bony lesions. Ocular globes and orbital contents are unremarkable. Mastoid air cells are well aerated.  IMPRESSION: Acute on chronic appearing mild left maxillary sinusitis with soft tissue effacing left ostiomeatal unit.   Electronically Signed   By: Elon Alas   On: 03/03/2014 05:24        Wynetta Fines, MD 03/03/14 [phone]

## 2015-01-23 IMAGING — CT CT PARANASAL SINUSES LIMITED
3 series · 16 of 47 positions shown, 19 images · non-contrast
Comparison: Paranasal sinus CT July 03, 2009

CLINICAL DATA: Frontal headache, facial pain, history of breast
cancer.

EXAM:
CT PARANASAL SINUS WITHOUT CONTRAST
TECHNIQUE: Multidetector CT images of the paranasal sinuses were obtained using
the standard protocol without intravenous contrast.

[Series 4: axial st · axial · 0.29mm/px · z∈[-102,-32]mm · 10 of 41 slices shown, 13 images]
[im 3/41  brain]
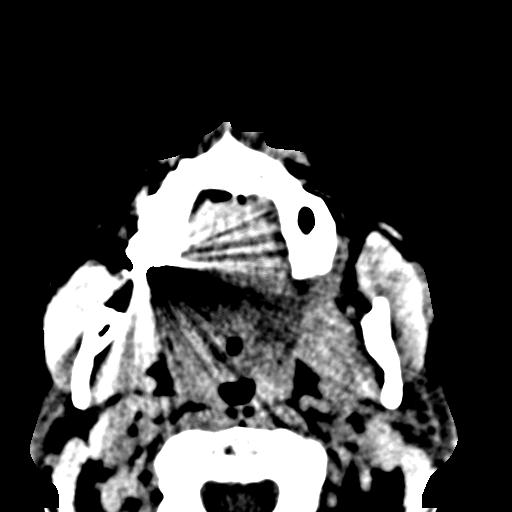
[im 3/41  bone]
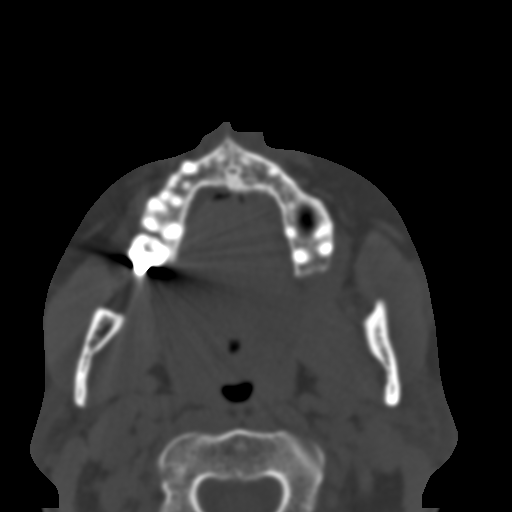
[im 7/41  bone]
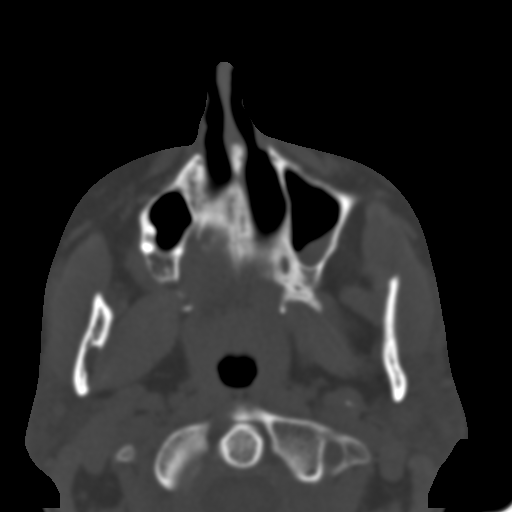
[im 12/41  bone]
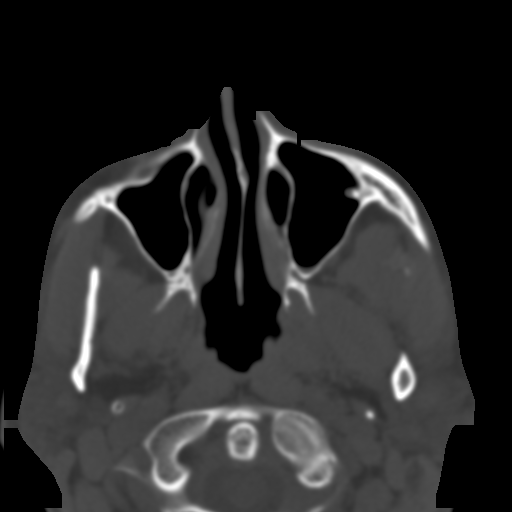
[im 14/41  bone]
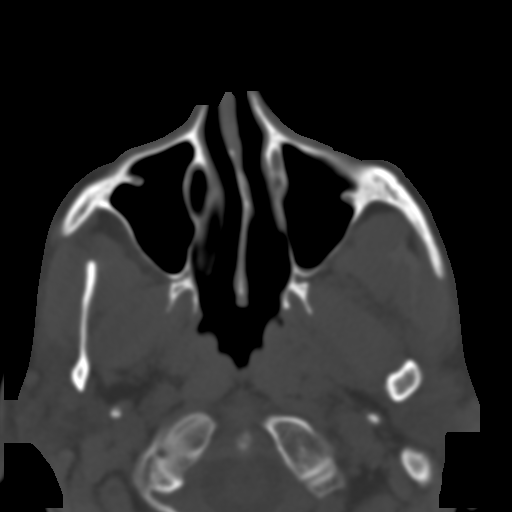
[im 18/41  brain]
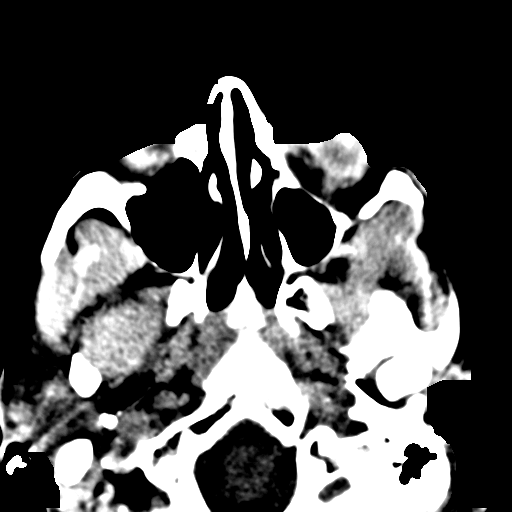
[im 18/41  bone]
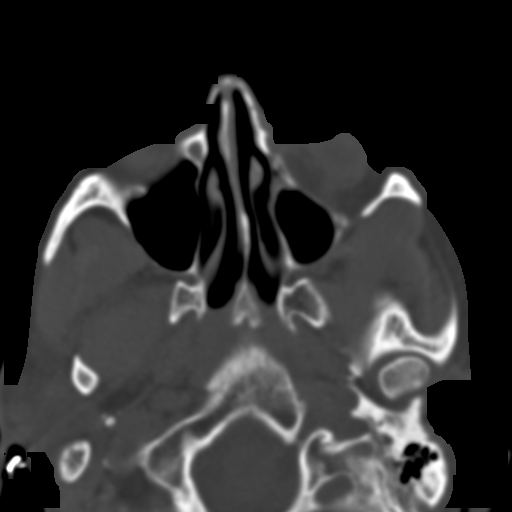
[im 23/41  bone]
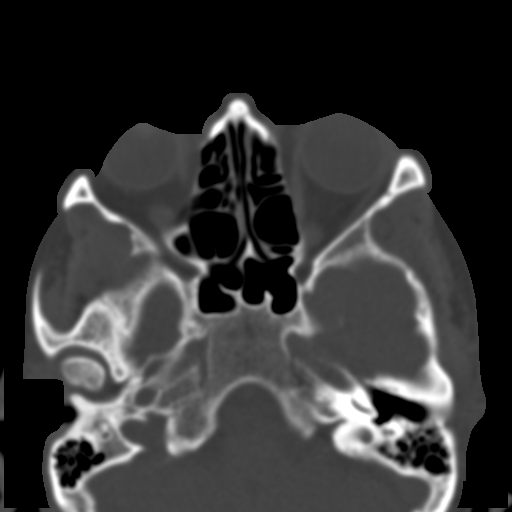
[im 27/41  bone]
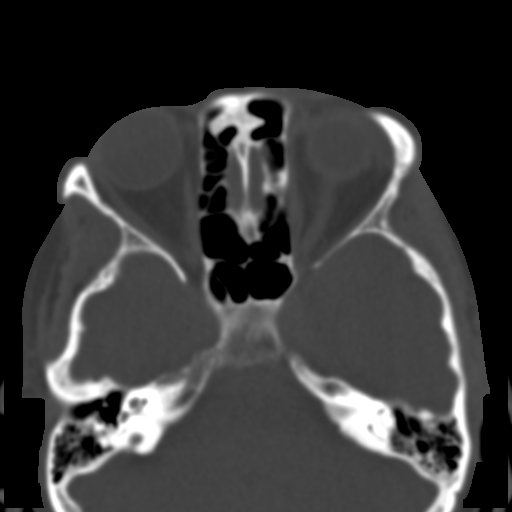
[im 31/41  bone]
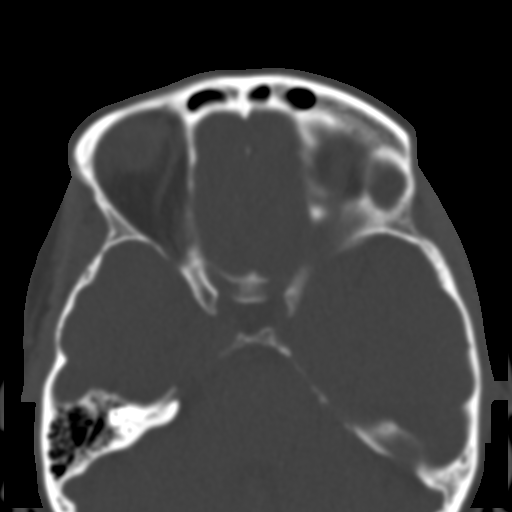
[im 34/41  brain]
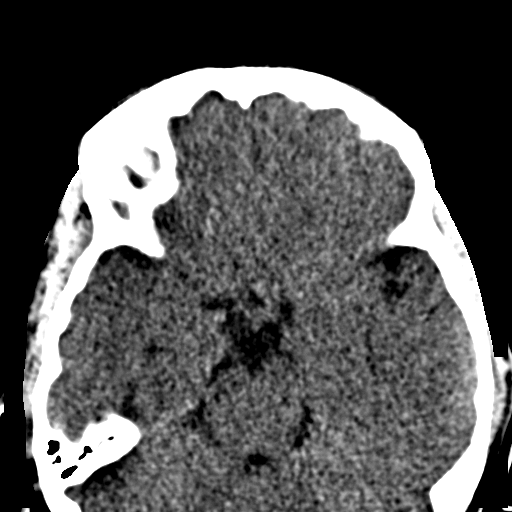
[im 34/41  bone]
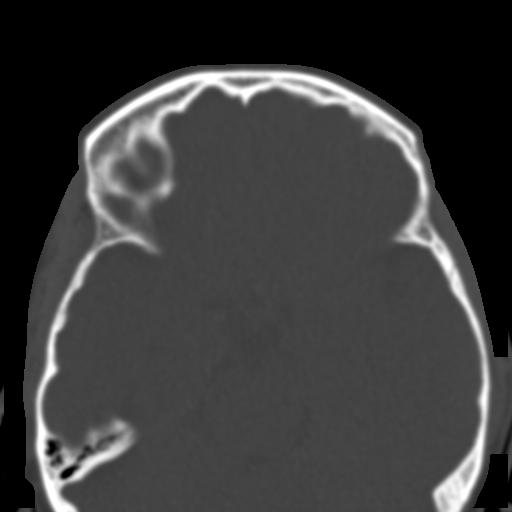
[im 38/41  bone]
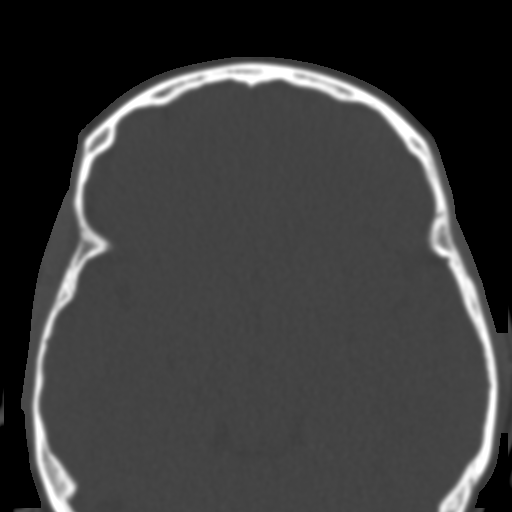

[Series 605: <mpr thick range(2)> · coronal · 0.29mm/px · 3 of 56 slices shown]
[im 19/56  bone]
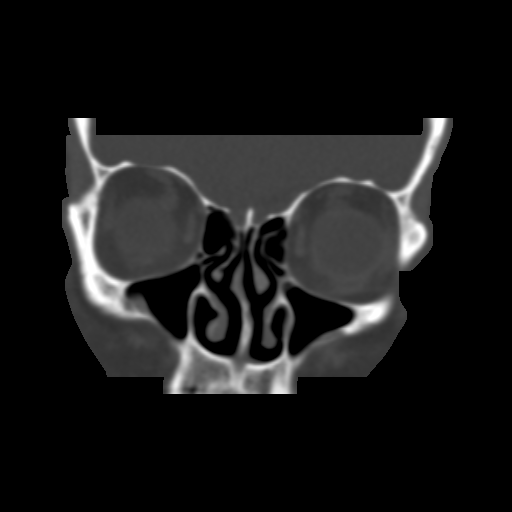
[im 25/56  bone]
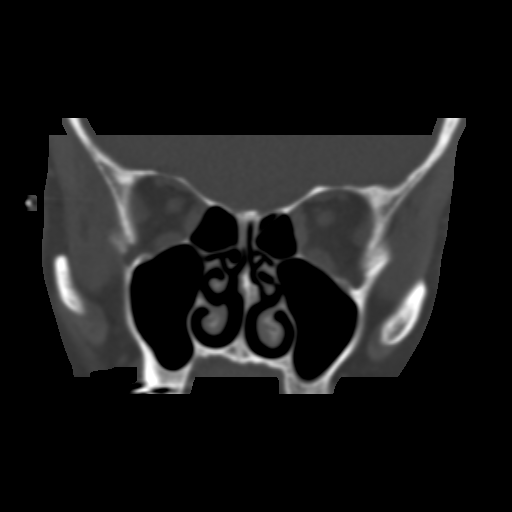
[im 31/56  bone]
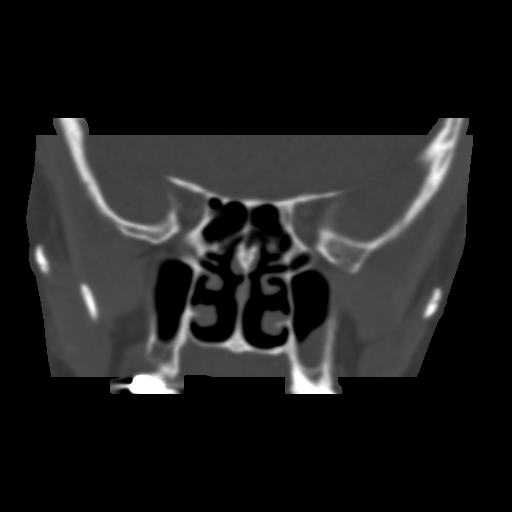

[Series 606: <mpr thick range(3)> · sagittal · 0.29mm/px · 3 of 70 slices shown]
[im 24/70  bone]
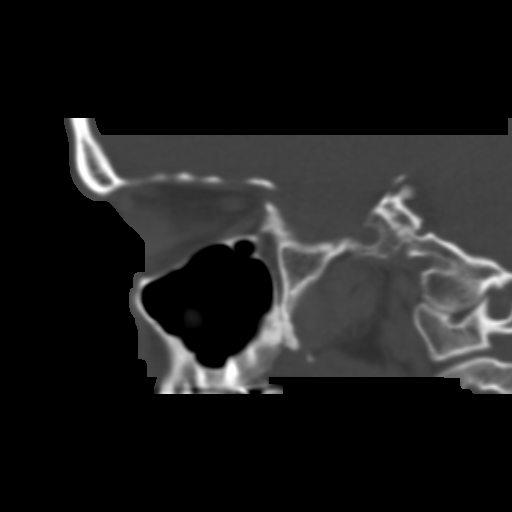
[im 35/70  bone]
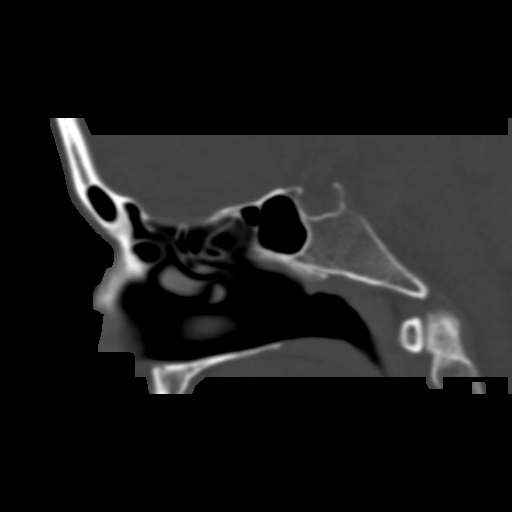
[im 47/70  bone]
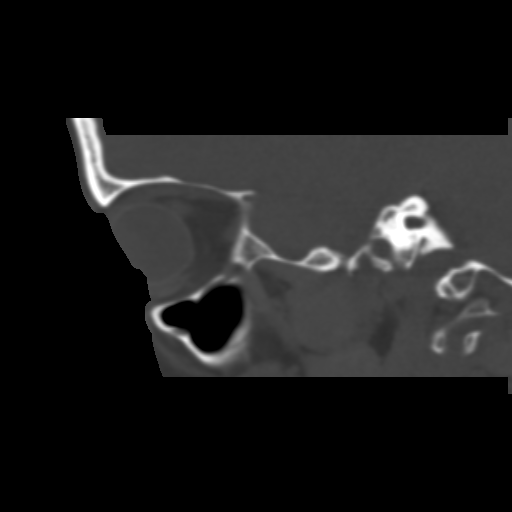

[16 of 47 positions shown; findings below may reference images not displayed]

FINDINGS: Mild left maxillary mucosal thickening, with small left maxillary
mucosal retention cyst. Right maxillary sinus, bilateral sphenoid,
ethmoid and frontal sinuses are well aerated.

Nasal septum deviated to the right. Soft tissue partially effaces
the left ostiomeatal unit, the right is patent, sphenoid ethmoidal
and frontoethmoidal recesses are widely patent.

No destructive bony lesions. Ocular globes and orbital contents are
unremarkable. Mastoid air cells are well aerated.
IMPRESSION: Acute on chronic appearing mild left maxillary sinusitis with soft
tissue effacing left ostiomeatal unit.

  By: Benk Baltus

## 2015-04-06 ENCOUNTER — Other Ambulatory Visit: Payer: Self-pay | Admitting: Obstetrics and Gynecology

## 2015-04-07 LAB — CYTOLOGY - PAP

## 2015-06-22 ENCOUNTER — Other Ambulatory Visit: Payer: Self-pay | Admitting: Dermatology

## 2015-07-20 ENCOUNTER — Ambulatory Visit (INDEPENDENT_AMBULATORY_CARE_PROVIDER_SITE_OTHER): Payer: 59 | Admitting: Family Medicine

## 2015-07-20 VITALS — BP 124/80 | HR 67 | Temp 98.2°F | Resp 18 | Ht 63.0 in | Wt 155.0 lb

## 2015-07-20 DIAGNOSIS — R0602 Shortness of breath: Secondary | ICD-10-CM

## 2015-07-20 MED ORDER — ALBUTEROL SULFATE (2.5 MG/3ML) 0.083% IN NEBU
2.5000 mg | INHALATION_SOLUTION | Freq: Once | RESPIRATORY_TRACT | Status: AC
  Administered 2015-07-20: 2.5 mg via RESPIRATORY_TRACT

## 2015-07-20 MED ORDER — DOXYCYCLINE HYCLATE 100 MG PO CAPS: 100.0000 mg | ORAL_CAPSULE | Freq: Two times a day (BID) | ORAL | Status: DC

## 2015-07-20 MED ORDER — ALBUTEROL SULFATE HFA 108 (90 BASE) MCG/ACT IN AERS: 2.0000 | INHALATION_SPRAY | Freq: Four times a day (QID) | RESPIRATORY_TRACT | Status: DC | PRN

## 2015-07-20 MED ORDER — IPRATROPIUM BROMIDE 0.03 % NA SOLN: 2.0000 | Freq: Four times a day (QID) | NASAL | Status: DC

## 2015-07-20 NOTE — Patient Instructions (Signed)
We are going to treat you with doxycycline antibiotic, atrovent nasal spray as needed and albuterol inhaler as needed Let us know if you are not feeling better in the next few days- Sooner if worse.

## 2015-07-20 NOTE — Progress Notes (Signed)
[facility] [address] [place] [address] [phone]  Date:  07/20/2015   Name:  Jodi Nunez   DOB:  08/14/1961   MRN:  [id number]  PCP:  No primary care provider on file.    Chief Complaint: Shortness of Breath; Nasal Congestion; Chest Pain; Ear Pain; and Cough   History of Present Illness:  Jodi Nunez is a 54 y.o. very pleasant female patient who presents with the following:  Here today with illness.  She notes cold sx 3-4 weeks ago; she treated at home with neti- pot, OTC meds as needed.  Over the last week or so she has noted that she developed a cough, she feels SOB with exertion.   Cough can be productive The cough "sounds dry."  She has not noted a fever She has pain in her back with cough No exertional pain She has not noted any wheezing.   She has tried some ibuprofen  She did have breast cancer about 15 years ago- she is followed just by mammograms at this time.  She is an Geologist, engineering at [employer].   No menses any longer No history of PE or DVT, no calf swelling, no hormone use  She quit smoking about 10 years ago  There are no active problems to display for this patient.   Past Medical History  Diagnosis Date  . Menopausal and perimenopausal disorder   . Breast cancer ([place]) 2006    ER-/PR-/her2+  . Allergy     Past Surgical History  Procedure Laterality Date  . Mastectomy    . Breast surgery    . Cesarean section      Social History  Substance Use Topics  . Smoking status: Former Smoker -- 0.20 packs/day for 10 years    Types: Cigarettes  . Smokeless tobacco: None  . Alcohol Use: Yes     Comment: 8 glasses of wine/week    Family History  Problem Relation Age of Onset  . Renal cancer Mother 68  . Esophageal cancer Father 30  . Heart disease Father   . Breast cancer Paternal Aunt     dx in her 84s  . Uterine cancer Maternal Grandmother 80    Allergies  Allergen Reactions  . Penicillins Anaphylaxis     Medication list has been reviewed and updated.  Current Outpatient Prescriptions on File Prior to Visit  Medication Sig Dispense Refill  . amphetamine-dextroamphetamine (ADDERALL) 20 MG tablet Take 20 mg by mouth daily.    Marland Kitchen BIOTIN PO Take 1 tablet by mouth daily.    . Multiple Vitamin (MULITIVITAMIN WITH MINERALS) TABS Take 1 tablet by mouth daily.    Marland Kitchen CALCIUM PO Take 1 tablet by mouth daily.    . Cholecalciferol (VITAMIN D PO) Take by mouth.    . doxycycline (VIBRAMYCIN) 100 MG capsule Take 1 capsule (100 mg total) by mouth 2 (two) times daily. One po bid x 7 days (Patient not taking: Reported on 07/20/2015) 14 capsule 0  . HYDROcodone-acetaminophen (NORCO) 5-325 MG per tablet Take 1-2 tablets by mouth every 6 (six) hours as needed (for pain). (Patient not taking: Reported on 07/20/2015) 20 tablet 0   No current facility-administered medications on file prior to visit.    Review of Systems:  As per HPI- otherwise negative.    Physical Examination: Filed Vitals:   07/20/15 1104  BP: 124/80  Pulse: 67  Temp: 98.2 F (36.8 C)  Resp: 18  Filed Vitals:   07/20/15 1104  Height: _0  (1.6 m)  Weight: 155 lb (70.308 kg)   Body mass index is 27.46 kg/(m^2). Ideal Body Weight: Weight in (lb) to have BMI = 25: 140.8  GEN: WDWN, NAD, Non-toxic, A & O x 3, mild overweight, looks well HEENT: Atraumatic, Normocephalic. Neck supple. No masses, No LAD.  Bilateral TM wnl, oropharynx normal.  PEERL,EOMI.  Ears and Nose: No external deformity.   CV: RRR, No M/G/R. No JVD. No thrill. No extra heart sounds. PULM: CTA B, no wheezes, crackles, rhonchi. No retractions. No resp. distress. No accessory muscle use. EXTR: No c/c/e NEURO Normal gait.  PSYCH: Normally interactive. Conversant. Not depressed or anxious appearing.  Calm demeanor.   Albuterol neb:  This did improve her air movement and her symptoms   Assessment and Plan: SOB (shortness of breath) - Plan: albuterol  (PROVENTIL) (2.5 MG/3ML) 0.083% nebulizer solution 2.5 mg, doxycycline (VIBRAMYCIN) 100 MG capsule, ipratropium (ATROVENT) 0.03 % nasal spray, albuterol (PROVENTIL HFA;VENTOLIN HFA) 108 (90 BASE) MCG/ACT inhaler  Here today with chest tightness following several weeks of URI sx.  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  She will let me know if not feeling better soon- Sooner if worse.      Signed Lamar Blinks, MD

## 2015-09-13 MED FILL — ZOLPIDEM TARTRATE 10 MG TAB: 10 | 90 days supply | Qty: 90 | Fill #1

## 2015-09-14 MED FILL — RIZATRIPTAN 10 MG TABLET: 10 | 90 days supply | Qty: 27 | Fill #1

## 2015-09-28 DIAGNOSIS — N951 Menopausal and female climacteric states: Secondary | ICD-10-CM | POA: Diagnosis not present

## 2015-09-29 MED FILL — AMPHETAMINE SALTS 20 MG TAB: 20 | 30 days supply | Qty: 30 | Fill #0

## 2015-10-02 DIAGNOSIS — E538 Deficiency of other specified B group vitamins: Secondary | ICD-10-CM | POA: Diagnosis not present

## 2015-10-02 DIAGNOSIS — N951 Menopausal and female climacteric states: Secondary | ICD-10-CM | POA: Diagnosis not present

## 2015-10-12 DIAGNOSIS — E538 Deficiency of other specified B group vitamins: Secondary | ICD-10-CM | POA: Diagnosis not present

## 2015-10-13 DIAGNOSIS — N390 Urinary tract infection, site not specified: Secondary | ICD-10-CM | POA: Diagnosis not present

## 2015-10-19 DIAGNOSIS — E538 Deficiency of other specified B group vitamins: Secondary | ICD-10-CM | POA: Diagnosis not present

## 2015-10-26 DIAGNOSIS — E538 Deficiency of other specified B group vitamins: Secondary | ICD-10-CM | POA: Diagnosis not present

## 2015-11-02 DIAGNOSIS — E538 Deficiency of other specified B group vitamins: Secondary | ICD-10-CM | POA: Diagnosis not present

## 2015-11-02 DIAGNOSIS — N951 Menopausal and female climacteric states: Secondary | ICD-10-CM | POA: Diagnosis not present

## 2015-11-02 MED FILL — AMPHETAMINE SALTS 20 MG TAB: 20 | 30 days supply | Qty: 30 | Fill #0

## 2015-11-09 DIAGNOSIS — E538 Deficiency of other specified B group vitamins: Secondary | ICD-10-CM | POA: Diagnosis not present

## 2015-11-16 DIAGNOSIS — E538 Deficiency of other specified B group vitamins: Secondary | ICD-10-CM | POA: Diagnosis not present

## 2015-11-16 DIAGNOSIS — N951 Menopausal and female climacteric states: Secondary | ICD-10-CM | POA: Diagnosis not present

## 2015-11-23 DIAGNOSIS — E538 Deficiency of other specified B group vitamins: Secondary | ICD-10-CM | POA: Diagnosis not present

## 2015-11-30 DIAGNOSIS — E538 Deficiency of other specified B group vitamins: Secondary | ICD-10-CM | POA: Diagnosis not present

## 2015-12-07 DIAGNOSIS — E538 Deficiency of other specified B group vitamins: Secondary | ICD-10-CM | POA: Diagnosis not present

## 2015-12-07 MED FILL — RIZATRIPTAN 10 MG TABLET: 10 | 90 days supply | Qty: 27 | Fill #2

## 2015-12-07 MED FILL — AMPHETAMINE SALTS 20 MG TAB: 20 | 30 days supply | Qty: 30 | Fill #0

## 2015-12-09 MED FILL — ZOLPIDEM TARTRATE 10 MG TAB: 10 | 90 days supply | Qty: 90 | Fill #0

## 2015-12-14 DIAGNOSIS — E538 Deficiency of other specified B group vitamins: Secondary | ICD-10-CM | POA: Diagnosis not present

## 2015-12-21 DIAGNOSIS — E538 Deficiency of other specified B group vitamins: Secondary | ICD-10-CM | POA: Diagnosis not present

## 2015-12-21 DIAGNOSIS — N951 Menopausal and female climacteric states: Secondary | ICD-10-CM | POA: Diagnosis not present

## 2015-12-24 DIAGNOSIS — N951 Menopausal and female climacteric states: Secondary | ICD-10-CM | POA: Diagnosis not present

## 2015-12-24 DIAGNOSIS — E538 Deficiency of other specified B group vitamins: Secondary | ICD-10-CM | POA: Diagnosis not present

## 2015-12-29 DIAGNOSIS — E538 Deficiency of other specified B group vitamins: Secondary | ICD-10-CM | POA: Diagnosis not present

## 2016-01-11 DIAGNOSIS — E538 Deficiency of other specified B group vitamins: Secondary | ICD-10-CM | POA: Diagnosis not present

## 2016-01-19 DIAGNOSIS — E538 Deficiency of other specified B group vitamins: Secondary | ICD-10-CM | POA: Diagnosis not present

## 2016-02-01 DIAGNOSIS — E538 Deficiency of other specified B group vitamins: Secondary | ICD-10-CM | POA: Diagnosis not present

## 2016-02-09 DIAGNOSIS — E538 Deficiency of other specified B group vitamins: Secondary | ICD-10-CM | POA: Diagnosis not present

## 2016-02-15 MED FILL — DEXTROAMP-AMPHETAMIN 20 MG: 20 | 30 days supply | Qty: 30 | Fill #0

## 2016-02-16 DIAGNOSIS — E538 Deficiency of other specified B group vitamins: Secondary | ICD-10-CM | POA: Diagnosis not present

## 2016-03-07 MED FILL — RIZATRIPTAN 10 MG TABLET: 10 | 90 days supply | Qty: 27 | Fill #3

## 2016-03-07 MED FILL — ZOLPIDEM TARTRATE 10 MG TAB: 10 | 90 days supply | Qty: 90 | Fill #1

## 2016-03-15 DIAGNOSIS — E538 Deficiency of other specified B group vitamins: Secondary | ICD-10-CM | POA: Diagnosis not present

## 2016-03-21 DIAGNOSIS — N951 Menopausal and female climacteric states: Secondary | ICD-10-CM | POA: Diagnosis not present

## 2016-03-23 MED FILL — DEXTROAMP-AMPHETAMIN 20 MG: 20 | 30 days supply | Qty: 30 | Fill #0

## 2016-03-29 DIAGNOSIS — N951 Menopausal and female climacteric states: Secondary | ICD-10-CM | POA: Diagnosis not present

## 2016-03-29 DIAGNOSIS — E538 Deficiency of other specified B group vitamins: Secondary | ICD-10-CM | POA: Diagnosis not present

## 2016-04-05 DIAGNOSIS — E538 Deficiency of other specified B group vitamins: Secondary | ICD-10-CM | POA: Diagnosis not present

## 2016-04-14 DIAGNOSIS — Z6825 Body mass index (BMI) 25.0-25.9, adult: Secondary | ICD-10-CM | POA: Diagnosis not present

## 2016-04-14 DIAGNOSIS — Z01419 Encounter for gynecological examination (general) (routine) without abnormal findings: Secondary | ICD-10-CM | POA: Diagnosis not present

## 2016-04-21 MED FILL — DEXTROAMP-AMPHETAMIN 20 MG: 20 | 30 days supply | Qty: 30 | Fill #0

## 2016-05-22 ENCOUNTER — Emergency Department (HOSPITAL_COMMUNITY)
Admission: EM | Admit: 2016-05-22 | Discharge: 2016-05-22 | Disposition: A | Discharge status: Home / Self Care | Payer: 59 | Attending: Emergency Medicine | Admitting: Emergency Medicine

## 2016-05-22 ENCOUNTER — Encounter (HOSPITAL_COMMUNITY): Payer: Self-pay | Admitting: Emergency Medicine

## 2016-05-22 ENCOUNTER — Ambulatory Visit (HOSPITAL_COMMUNITY)
Admission: EM | Admit: 2016-05-22 | Discharge: 2016-05-22 | Disposition: A | Discharge status: Home / Self Care | Payer: 59 | Attending: Family Medicine | Admitting: Family Medicine

## 2016-05-22 ENCOUNTER — Encounter (HOSPITAL_COMMUNITY): Payer: Self-pay | Admitting: *Deleted

## 2016-05-22 DIAGNOSIS — H547 Unspecified visual loss: Secondary | ICD-10-CM

## 2016-05-22 DIAGNOSIS — H5461 Unqualified visual loss, right eye, normal vision left eye: Secondary | ICD-10-CM | POA: Diagnosis not present

## 2016-05-22 DIAGNOSIS — Z79899 Other long term (current) drug therapy: Secondary | ICD-10-CM | POA: Diagnosis not present

## 2016-05-22 DIAGNOSIS — Z87891 Personal history of nicotine dependence: Secondary | ICD-10-CM | POA: Insufficient documentation

## 2016-05-22 DIAGNOSIS — Z853 Personal history of malignant neoplasm of breast: Secondary | ICD-10-CM | POA: Diagnosis not present

## 2016-05-22 HISTORY — DX: Migraine, unspecified, not intractable, without status migrainosus: G43.909

## 2016-05-22 NOTE — ED Triage Notes (Signed)
Pt went to see urgent care for loss of vision.  She had a migraine on Thursday night which subsided then.  When the migraine started she noticed she had black "dots" in her visual field as well as "floatys".  Pt reports those never went away and now in the right eye she sees so many black "dots" it almost feels like she has a "shield" in front of it. No pain since Thursday night.

## 2016-05-22 NOTE — ED Provider Notes (Signed)
CSN: [id number]     Arrival date & time 05/22/16  1837 History   None    Chief Complaint  Patient presents with  . Loss of Vision   (Consider location/radiation/quality/duration/timing/severity/associated sxs/prior Treatment) 55 y.o. female presents with history of migraines presents with persistent and worsening vision problems X 3 days. Patient states "bright streaks of light and little floaties" in peripheral vision of right eye" with a "dot".  Yesterday "dot" went away, but got an "increase in floaties".  Today has "significant increase" in floaties" and now central vision in right eye "looks like millions of tiny dots of black" if she closes left eye. Patient describes this as 'showers"  Condition is acute in nature. Condition is made better by nothing. Condition is made worse by nothing. Patient had a migraine on Thursday that resolved with her prescription medication and rescue medication. Patient states that her migraines typically do not have a aura and denies and denies any problems with her vision with prior migraines. Patient states that this not a typical migraine and is concerned for a detached retina. Patient denies any trauma  Patient being referred to the ED for further evaluation and possible intervention.        Past Medical History:  Diagnosis Date  . Allergy   . Breast cancer ([place]) 2006   ER-/PR-/her2+  . Menopausal and perimenopausal disorder   . Migraines    Past Surgical History:  Procedure Laterality Date  . BREAST SURGERY    . CESAREAN SECTION    . lymph node resection    . MASTECTOMY     Family History  Problem Relation Age of Onset  . Renal cancer Mother 65  . Esophageal cancer Father 46  . Heart disease Father   . Breast cancer Paternal Aunt     dx in her 41s  . Uterine cancer Maternal Grandmother 80   Social History  Substance Use Topics  . Smoking status: Former Smoker    Packs/day: 0.20    Years: 10.00    Types: Cigarettes  . Smokeless  tobacco: Never Used  . Alcohol use Yes     Comment: occasional   OB History    No data available     Review of Systems  Constitutional: Negative.   Respiratory: Negative.   Cardiovascular: Negative.   Neurological: Negative for dizziness.       Peripheral vision issues and floater    Allergies  Penicillins  Home Medications   Prior to Admission medications   Medication Sig Start Date End Date Taking? Authorizing Provider  amphetamine-dextroamphetamine (ADDERALL) 20 MG tablet Take 20 mg by mouth daily.   Yes Historical Provider, MD  Rizatriptan Benzoate (MAXALT PO) Take by mouth.   Yes Historical Provider, MD  albuterol (PROVENTIL HFA;VENTOLIN HFA) 108 (90 BASE) MCG/ACT inhaler Inhale 2 puffs into the lungs every 6 (six) hours as needed for wheezing or shortness of breath. 07/20/15   Gay Filler Copland, MD  BIOTIN PO Take 1 tablet by mouth daily.    Historical Provider, MD  CALCIUM PO Take 1 tablet by mouth daily.    Historical Provider, MD  Cholecalciferol (VITAMIN D PO) Take by mouth.    Historical Provider, MD  doxycycline (VIBRAMYCIN) 100 MG capsule Take 1 capsule (100 mg total) by mouth 2 (two) times daily. 07/20/15   Gay Filler Copland, MD  ipratropium (ATROVENT) 0.03 % nasal spray Place 2 sprays into the nose 4 (four) times daily. 07/20/15   Jessica C Copland,  MD  Multiple Vitamin (MULITIVITAMIN WITH MINERALS) TABS Take 1 tablet by mouth daily.    Historical Provider, MD   Meds Ordered and Administered this Visit  Medications - No data to display  BP 133/75 (BP Location: Left Arm)   Pulse 60   Temp 98.6 F (37 C) (Oral)   Resp 16   Ht 5' 3"  (1.6 m)   Wt 143 lb (64.9 kg)   SpO2 97%   BMI 25.33 kg/m  No data found.   Physical Exam  Constitutional: She is oriented to person, place, and time. She appears well-developed and well-nourished.  HENT:  Head: Normocephalic and atraumatic.  Eyes: Conjunctivae are normal.  Neck: Normal range of motion.  Pulmonary/Chest:  Effort normal.  Musculoskeletal: Normal range of motion.  Neurological: She is alert and oriented to person, place, and time.  Skin: Skin is warm.  Psychiatric: She has a normal mood and affect.  Nursing note and vitals reviewed.   Urgent Care Course   Clinical Course    Procedures (including critical care time)  Labs Review Labs Reviewed - No data to display  Imaging Review No results found.   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 Vision problem        Jacqualine Mau, NP 05/22/16 2007

## 2016-05-22 NOTE — Discharge Instructions (Signed)
As we discussed nothing to eat or drink after midnight. Sleep with the right side down right side on the pillow. Follow-up with ophthalmology first thing in the morning. Office information and phone number provided above.

## 2016-05-22 NOTE — ED Notes (Signed)
Notified [facility] ED First Nurse that pt is choosing to go to [facility] ED.  Notified [facility] ED Charge RN, that pt is choosing to go there via private vehicle with spouse.

## 2016-05-22 NOTE — Discharge Instructions (Signed)
ED for further evaluation and possible intervention

## 2016-05-22 NOTE — ED Provider Notes (Signed)
[facility] DEPT Provider Note   CSN: [id number] Arrival date & time: 05/22/16  2025     History   Chief Complaint Chief Complaint  Patient presents with  . Loss of Vision    HPI Jodi Nunez is a 55 y.o. female.  Patient with acute changes in the vision in the right eye that seems to be predominantly on the lateral peripheral aspect starting on Friday. Patient had flashes lites then black spots. Black spots improved some on Saturday but patient had multiple floaters. Today the black spots returned and patient feels on the medial aspect of the right eye there is a veil. Patient went to urgent care and was referred to the emergency department for further evaluation.  Patient denies any eye pain. Patient does not wear contacts. Patient does have a history of migraines and did have one earlier in the week but doesn't the symptoms usually cause no visual changes and they're usually more involving the left side of her head and face. Patient does not feel that the migraine is related. Patient currently has no headache. Patient's never had any eye symptoms with migraines or any symptoms like this in the past.  Patient's visual acuities was 20/50 in the right eye and 20/30 in the left eye.      Past Medical History:  Diagnosis Date  . Allergy   . Breast cancer ([place]) 2006   ER-/PR-/her2+  . Menopausal and perimenopausal disorder   . Migraines     There are no active problems to display for this patient.   Past Surgical History:  Procedure Laterality Date  . BREAST SURGERY    . CESAREAN SECTION    . lymph node resection    . MASTECTOMY      OB History    No data available       Home Medications    Prior to Admission medications   Medication Sig Start Date End Date Taking? Authorizing Provider  amphetamine-dextroamphetamine (ADDERALL) 20 MG tablet Take 20 mg by mouth daily.   Yes Historical Provider, MD  BIOTIN PO Take 1 tablet by mouth daily.   Yes Historical  Provider, MD  CALCIUM PO Take 1 tablet by mouth daily.   Yes Historical Provider, MD  Cholecalciferol (VITAMIN D PO) Take 1 tablet by mouth daily.    Yes Historical Provider, MD  ibuprofen (ADVIL,MOTRIN) 200 MG tablet Take 600 mg by mouth every 6 (six) hours as needed for moderate pain.   Yes Historical Provider, MD  Multiple Vitamin (MULITIVITAMIN WITH MINERALS) TABS Take 1 tablet by mouth daily.   Yes Historical Provider, MD  rizatriptan (MAXALT) 10 MG tablet Take 10 mg by mouth daily as needed for migraine. 03/07/16  Yes Historical Provider, MD  zolpidem (AMBIEN) 10 MG tablet Take 10 mg by mouth at bedtime. 03/07/16  Yes Historical Provider, MD  albuterol (PROVENTIL HFA;VENTOLIN HFA) 108 (90 BASE) MCG/ACT inhaler Inhale 2 puffs into the lungs every 6 (six) hours as needed for wheezing or shortness of breath. Patient not taking: Reported on 05/22/2016 07/20/15   Darreld Mclean, MD  doxycycline (VIBRAMYCIN) 100 MG capsule Take 1 capsule (100 mg total) by mouth 2 (two) times daily. Patient not taking: Reported on 05/22/2016 07/20/15   Gay Filler Copland, MD  ipratropium (ATROVENT) 0.03 % nasal spray Place 2 sprays into the nose 4 (four) times daily. Patient not taking: Reported on 05/22/2016 07/20/15   Darreld Mclean, MD    Family History Family History  Problem Relation Age of Onset  . Renal cancer Mother 72  . Esophageal cancer Father 68  . Heart disease Father   . Breast cancer Paternal Aunt     dx in her 58s  . Uterine cancer Maternal Grandmother 80    Social History Social History  Substance Use Topics  . Smoking status: Former Smoker    Packs/day: 0.20    Years: 10.00    Types: Cigarettes  . Smokeless tobacco: Never Used  . Alcohol use Yes     Comment: occasional     Allergies   Penicillins   Review of Systems Review of Systems  Constitutional: Negative for fever.  HENT: Negative for congestion.   Eyes: Positive for visual disturbance. Negative for pain.    Respiratory: Negative for shortness of breath.   Cardiovascular: Negative for chest pain.  Gastrointestinal: Negative for abdominal pain.  Genitourinary: Negative for dysuria.  Musculoskeletal: Negative for back pain and neck pain.  Skin: Negative for rash.  Neurological: Positive for headaches.  Hematological: Does not bruise/bleed easily.  Psychiatric/Behavioral: Negative for confusion.     Physical Exam Updated Vital Signs BP 134/87 (BP Location: Right Arm)   Pulse 111   Temp 98.5 F (36.9 C) (Oral)   Resp 15   Ht 5' 3"  (1.6 m)   Wt 64.9 kg   SpO2 100%   BMI 25.33 kg/m   Physical Exam  Constitutional: She is oriented to person, place, and time. She appears well-developed and well-nourished.  HENT:  Head: Normocephalic and atraumatic.  Eyes: Conjunctivae and EOM are normal. Pupils are equal, round, and reactive to light.  Limited visualization of the retinas in both eyes appears normal.  Neck: Normal range of motion.  Cardiovascular: Normal rate, regular rhythm and normal heart sounds.   No murmur heard. Pulmonary/Chest: Effort normal and breath sounds normal. No respiratory distress.  Abdominal: Soft. Bowel sounds are normal. There is no tenderness.  Musculoskeletal: Normal range of motion.  Neurological: She is alert and oriented to person, place, and time. No cranial nerve deficit. She exhibits normal muscle tone. Coordination normal.  Skin: Skin is warm.  Nursing note and vitals reviewed.    ED Treatments / Results  Labs (all labs ordered are listed, but only abnormal results are displayed) Labs Reviewed - No data to display  EKG  EKG Interpretation None       Radiology No results found.  Procedures Procedures (including critical care time)  Medications Ordered in ED Medications - No data to display   Initial Impression / Assessment and Plan / ED Course  I have reviewed the triage vital signs and the nursing notes.  Pertinent labs & imaging  results that were available during my care of the patient were reviewed by me and considered in my medical decision making (see chart for details).  Clinical Course   Patient referred from [facility] urgent care. Patient with visual changes in the right eye starting on Friday. To include day of peripheral on the lateral side flashes of light and then black spots. Today that included a feeling of a veil over the medial aspect of the right eye. No pain no left eye symptoms. Visual acuity in the right eye is 20/50 left eye is 20/30. Limited visualization of the retina without any significant findings. Discussed with Dr. Posey Pronto on call ophthalmology. He will see her in the office first thing in the morning. Patient is to be nothing by mouth after midnight. Patient is to  sleep with the right side of her head on the pillow that his right side dependent. The concern is for retinal detachment.    Final Clinical Impressions(s) / ED Diagnoses   Final diagnoses:  Visual loss    New Prescriptions New Prescriptions   No medications on file     Fredia Sorrow, MD 05/22/16 2336

## 2016-05-22 NOTE — ED Notes (Signed)
Report called to Angela Nevin, ED First Nurse.

## 2016-05-22 NOTE — ED Triage Notes (Signed)
Reports having a typical migraine 3 days ago; took IBU and Maxalt with relief of HA that night.  2 days ago started with "bright streaks of light and little floaties" in peripheral vision of right eye" with a "dot".  Yesterday "dot" went away, but got an "increase in floaties".  Today has "significant increase" in floaties" and now central vision in right eye "looks like millions of tiny dots of black" if she closes left eye.  Denies any injuries or trauma.  Has had no pain x 3 days.

## 2016-05-30 MED FILL — DEXTROAMP-AMPHETAMIN 20 MG: 20 | 30 days supply | Qty: 30 | Fill #0

## 2016-06-02 DIAGNOSIS — L814 Other melanin hyperpigmentation: Secondary | ICD-10-CM | POA: Diagnosis not present

## 2016-06-02 DIAGNOSIS — L820 Inflamed seborrheic keratosis: Secondary | ICD-10-CM | POA: Diagnosis not present

## 2016-06-02 DIAGNOSIS — D235 Other benign neoplasm of skin of trunk: Secondary | ICD-10-CM | POA: Diagnosis not present

## 2016-06-02 DIAGNOSIS — L659 Nonscarring hair loss, unspecified: Secondary | ICD-10-CM | POA: Diagnosis not present

## 2016-06-07 MED FILL — ZOLPIDEM TARTRATE 10 MG TAB: 10 | 90 days supply | Qty: 90 | Fill #0

## 2016-06-09 DIAGNOSIS — Z130 Encounter for screening for diseases of the blood and blood-forming organs and certain disorders involving the immune mechanism: Secondary | ICD-10-CM | POA: Diagnosis not present

## 2016-06-09 DIAGNOSIS — Z131 Encounter for screening for diabetes mellitus: Secondary | ICD-10-CM | POA: Diagnosis not present

## 2016-06-09 DIAGNOSIS — Z1321 Encounter for screening for nutritional disorder: Secondary | ICD-10-CM | POA: Diagnosis not present

## 2016-06-09 DIAGNOSIS — Z13228 Encounter for screening for other metabolic disorders: Secondary | ICD-10-CM | POA: Diagnosis not present

## 2016-06-09 DIAGNOSIS — Z13220 Encounter for screening for lipoid disorders: Secondary | ICD-10-CM | POA: Diagnosis not present

## 2016-06-09 DIAGNOSIS — Z1329 Encounter for screening for other suspected endocrine disorder: Secondary | ICD-10-CM | POA: Diagnosis not present

## 2016-06-10 MED FILL — RIZATRIPTAN 10 MG TABLET: 10 | 90 days supply | Qty: 27 | Fill #0

## 2016-06-23 DIAGNOSIS — N951 Menopausal and female climacteric states: Secondary | ICD-10-CM | POA: Diagnosis not present

## 2016-06-29 DIAGNOSIS — E538 Deficiency of other specified B group vitamins: Secondary | ICD-10-CM | POA: Diagnosis not present

## 2016-06-29 DIAGNOSIS — N951 Menopausal and female climacteric states: Secondary | ICD-10-CM | POA: Diagnosis not present

## 2016-07-05 MED FILL — DEXTROAMP-AMPHETAMIN 20 MG: 20 | 30 days supply | Qty: 30 | Fill #0

## 2016-09-05 MED FILL — ZOLPIDEM TARTRATE 10 MG TAB: 10 | 90 days supply | Qty: 90 | Fill #1

## 2016-09-05 MED FILL — AMPHETAMINE SALTS 20 MG TAB: 20 | 30 days supply | Qty: 30 | Fill #0

## 2016-09-06 MED FILL — RIZATRIPTAN 10 MG TABLET: 10 | 90 days supply | Qty: 27 | Fill #1

## 2016-09-26 MED FILL — ESZOPICLONE 2 MG TABLET: 2 | 30 days supply | Qty: 30 | Fill #0

## 2016-11-02 ENCOUNTER — Ambulatory Visit (INDEPENDENT_AMBULATORY_CARE_PROVIDER_SITE_OTHER): Payer: 59 | Admitting: Family Medicine

## 2016-11-02 VITALS — BP 124/78 | HR 74 | Temp 98.3°F | Resp 16 | Ht 63.0 in | Wt 151.6 lb

## 2016-11-02 DIAGNOSIS — J0100 Acute maxillary sinusitis, unspecified: Secondary | ICD-10-CM | POA: Diagnosis not present

## 2016-11-02 DIAGNOSIS — R8290 Unspecified abnormal findings in urine: Secondary | ICD-10-CM | POA: Diagnosis not present

## 2016-11-02 DIAGNOSIS — H5213 Myopia, bilateral: Secondary | ICD-10-CM | POA: Diagnosis not present

## 2016-11-02 LAB — POCT URINALYSIS DIP (MANUAL ENTRY)
BILIRUBIN UA: NEGATIVE
BILIRUBIN UA: NEGATIVE
Blood, UA: NEGATIVE
GLUCOSE UA: NEGATIVE
LEUKOCYTES UA: NEGATIVE
NITRITE UA: NEGATIVE
Protein Ur, POC: NEGATIVE
Spec Grav, UA: 1.015
Urobilinogen, UA: 0.2
pH, UA: 5.5

## 2016-11-02 LAB — POC MICROSCOPIC URINALYSIS (UMFC): MUCUS RE: ABSENT

## 2016-11-02 MED ORDER — DOXYCYCLINE HYCLATE 100 MG PO CAPS: 100.0000 mg | ORAL_CAPSULE | Freq: Two times a day (BID) | ORAL | 0 refills | Status: AC

## 2016-11-02 MED FILL — DOXYCYCLINE HYCLATE 100 MG: 100 | 7 days supply | Qty: 14 | Fill #0

## 2016-11-02 NOTE — Progress Notes (Signed)
Jodi Nunez is a 56 y.o. female who presents to Primary Care at [facility] today for:   1.  Urine with odor. Patient states that for the last week she has been having urinary odor and pressure. She will usually have a UTI. Urine is also cloudy. Denies any dysuria. Patient states that discomfort comes and goes. Has had UTIs in the past last one was 2-3 years ago. Medications tried: cranberry juice and water. Any antibiotics in the last 30 days: no. No new medications.  More than 3 UTIs in the last 12 months: no STD exposure: no  Symptoms Urgency: yes, baseline Frequency: no Blood in urine: no Pain in back: no Fever: no Vaginal discharge: no  2. Sinus Pressure. Patient believes she is fighting a sinus infection. States that she has had some left-sided sinus congestion and pressure for several weeks now. She has been doing conservative measures like many pot and saline rinse. 6 that has not improved her symptoms. Endorses having a temperature Saturday of 101.37F. Has history of sinus infections. No associated ear discomfort and left-sided maxillary pressure. Also endorsing some crusting of the eyes in the morning.  ROS as above.  Pertinently, no chest pain, palpitations, SOB, Chills, Abd pain, N/V/D.   PMH reviewed. Patient is a former smoker                        .   Past Medical History:  Diagnosis Date  . Allergy   . Breast cancer ([place]) 2006   ER-/PR-/her2+  . Menopausal and perimenopausal disorder   . Migraines    Past Surgical History:  Procedure Laterality Date  . BREAST SURGERY    . CESAREAN SECTION    . lymph node resection    . MASTECTOMY      Medications reviewed. Current Outpatient Prescriptions  Medication Sig Dispense Refill  . amphetamine-dextroamphetamine (ADDERALL) 20 MG tablet Take 20 mg by mouth daily.    Marland Kitchen BIOTIN PO Take 1 tablet by mouth daily.    Marland Kitchen CALCIUM PO Take 1 tablet by mouth daily.    . Cholecalciferol (VITAMIN D PO) Take 1 tablet by mouth  daily.     Marland Kitchen ibuprofen (ADVIL,MOTRIN) 200 MG tablet Take 600 mg by mouth every 6 (six) hours as needed for moderate pain.    . Multiple Vitamin (MULITIVITAMIN WITH MINERALS) TABS Take 1 tablet by mouth daily.    . rizatriptan (MAXALT) 10 MG tablet Take 10 mg by mouth daily as needed for migraine.  3  . zolpidem (AMBIEN) 10 MG tablet Take 10 mg by mouth at bedtime.  1  . albuterol (PROVENTIL HFA;VENTOLIN HFA) 108 (90 BASE) MCG/ACT inhaler Inhale 2 puffs into the lungs every 6 (six) hours as needed for wheezing or shortness of breath. (Patient not taking: Reported on 05/22/2016) 1 Inhaler 0  . doxycycline (VIBRAMYCIN) 100 MG capsule Take 1 capsule (100 mg total) by mouth 2 (two) times daily. (Patient not taking: Reported on 05/22/2016) 20 capsule 0  . ipratropium (ATROVENT) 0.03 % nasal spray Place 2 sprays into the nose 4 (four) times daily. (Patient not taking: Reported on 05/22/2016) 30 mL 6   No current facility-administered medications for this visit.      Physical Exam:  BP 124/78   Pulse 74   Temp 98.3 F (36.8 C) (Oral)   Resp 16   Ht 5' 3"  (1.6 m)   Wt 151 lb 9.6 oz (68.8 kg)  SpO2 99%   BMI 26.85 kg/m  Gen:  Alert, cooperative patient who appears stated age in no acute distress.  Vital signs reviewed. HEENT: EOMI,  MMM, o/p clear. Mild maxillary tenderness on left. Nares normal.  Pulm:  Clear to auscultation bilaterally with good air movement.  No wheezes or rales noted.   Cardiac:  Regular rate and rhythm without murmur auscultated.  Good S1/S2. Psych: mood and affect appropriate   Results for orders placed or performed in visit on 11/02/16  POCT urinalysis dipstick  Result Value Ref Range   Color, UA yellow yellow   Clarity, UA clear clear   Glucose, UA negative negative   Bilirubin, UA negative negative   Ketones, POC UA negative negative   Spec Grav, UA 1.015 1.003, 1.005, 1.010, 1.015, 1.020, 1.025, 1.030, 1.035   Blood, UA negative negative   pH, UA 5.5 5.0, 5.5,  6.0, 6.5, 7.0, 7.5, 8.0   Protein Ur, POC negative negative   Urobilinogen, UA 0.2 0.2, 1.0   Nitrite, UA Negative Negative   Leukocytes, UA Negative Negative  POCT Microscopic Urinalysis ([facility])  Result Value Ref Range   WBC,UR,HPF,POC None None WBC/hpf   RBC,UR,HPF,POC None None RBC/hpf   Bacteria Few (A) None, Too numerous to count   Mucus Absent Absent   Epithelial Cells, UR Per Microscopy Few (A) None, Too numerous to count cells/hpf     Assessment and Plan:  1. Abnormal urine odor Unsure where patient's abnormal urine odor coming from. UA was unremarkable. Vital signs are stable and patient is well-appearing. Patient declined STD testing. Counseled patient on staying hydrated monitoring for worsening of symptoms. Will treat conservatively at this time. - POCT urinalysis dipstick - POCT Microscopic Urinalysis ([facility])  2. Acute non-recurrent maxillary sinusitis Rx given for doxycycline. Handout provided with term precautions. - doxycycline (VIBRAMYCIN) 100 MG capsule; Take 1 capsule (100 mg total) by mouth 2 (two) times daily.  Dispense: 14 capsule; Refill: 0   Luiz Blare, DO 11/02/2016, 4:14 PM PGY-3, [place]

## 2016-11-02 NOTE — Patient Instructions (Addendum)
Sinusitis, Adult Sinusitis is soreness and inflammation of your sinuses. Sinuses are hollow spaces in the bones around your face. Your sinuses are located:  Around your eyes.  In the middle of your forehead.  Behind your nose.  In your cheekbones. Your sinuses and nasal passages are lined with a stringy fluid (mucus). Mucus normally drains out of your sinuses. When your nasal tissues become inflamed or swollen, the mucus can become trapped or blocked so air cannot flow through your sinuses. This allows bacteria, viruses, and funguses to grow, which leads to infection. Sinusitis can develop quickly and last for 7?10 days (acute) or for more than 12 weeks (chronic). Sinusitis often develops after a cold. What are the causes? This condition is caused by anything that creates swelling in the sinuses or stops mucus from draining, including:  Allergies.  Asthma.  Bacterial or viral infection.  Abnormally shaped bones between the nasal passages.  Nasal growths that contain mucus (nasal polyps).  Narrow sinus openings.  Pollutants, such as chemicals or irritants in the air.  A foreign object stuck in the nose.  A fungal infection. This is rare. What increases the risk? The following factors may make you more likely to develop this condition:  Having allergies or asthma.  Having had a recent cold or respiratory tract infection.  Having structural deformities or blockages in your nose or sinuses.  Having a weak immune system.  Doing a lot of swimming or diving.  Overusing nasal sprays.  Smoking. What are the signs or symptoms? The main symptoms of this condition are pain and a feeling of pressure around the affected sinuses. Other symptoms include:  Upper toothache.  Earache.  Headache.  Bad breath.  Decreased sense of smell and taste.  A cough that may get worse at night.  Fatigue.  Fever.  Thick drainage from your nose. The drainage is often green and it may  contain pus (purulent).  Stuffy nose or congestion.  Postnasal drip. This is when extra mucus collects in the throat or back of the nose.  Swelling and warmth over the affected sinuses.  Sore throat.  Sensitivity to light. How is this diagnosed? This condition is diagnosed based on symptoms, a medical history, and a physical exam. To find out if your condition is acute or chronic, your health care provider may:  Look in your nose for signs of nasal polyps.  Tap over the affected sinus to check for signs of infection.  View the inside of your sinuses using an imaging device that has a light attached (endoscope). If your health care provider suspects that you have chronic sinusitis, you may also:  Be tested for allergies.  Have a sample of mucus taken from your nose (nasal culture) and checked for bacteria.  Have a mucus sample examined to see if your sinusitis is related to an allergy. If your sinusitis does not respond to treatment and it lasts longer than 8 weeks, you may have an MRI or CT scan to check your sinuses. These scans also help to determine how severe your infection is. In rare cases, a bone biopsy may be done to rule out more serious types of fungal sinus disease. How is this treated? Treatment for sinusitis depends on the cause and whether your condition is chronic or acute. If a virus is causing your sinusitis, your symptoms will go away on their own within 10 days. You may be given medicines to relieve your symptoms, including:  Topical nasal decongestants. They   symptoms, including:  Topical nasal decongestants. They shrink swollen nasal passages and let mucus drain from your sinuses.  Antihistamines. These drugs block inflammation that is triggered by allergies. This can help to ease swelling in your nose and sinuses.  Topical nasal corticosteroids. These are nasal sprays that ease inflammation and swelling in your nose and sinuses.  Nasal saline washes. These rinses can help to get rid of thick mucus  in your nose.  If your condition is caused by bacteria, you will be given an antibiotic medicine. If your condition is caused by a fungus, you will be given an antifungal medicine. Surgery may be needed to correct underlying conditions, such as narrow nasal passages. Surgery may also be needed to remove polyps. Follow these instructions at home: Medicines  Take, use, or apply over-the-counter and prescription medicines only as told by your health care provider. These may include nasal sprays.  If you were prescribed an antibiotic medicine, take it as told by your health care provider. Do not stop taking the antibiotic even if you start to feel better. Hydrate and Humidify  Drink enough water to keep your urine clear or pale yellow. Staying hydrated will help to thin your mucus.  Use a cool mist humidifier to keep the humidity level in your home above 50%.  Inhale steam for 10-15 minutes, 3-4 times a day or as told by your health care provider. You can do this in the bathroom while a hot shower is running.  Limit your exposure to cool or dry air. Rest  Rest as much as possible.  Sleep with your head raised (elevated).  Make sure to get enough sleep each night. General instructions  Apply a warm, moist washcloth to your face 3-4 times a day or as told by your health care provider. This will help with discomfort.  Wash your hands often with soap and water to reduce your exposure to viruses and other germs. If soap and water are not available, use hand sanitizer.  Do not smoke. Avoid being around people who are smoking (secondhand smoke).  Keep all follow-up visits as told by your health care provider. This is important. Contact a health care provider if:  You have a fever.  Your symptoms get worse.  Your symptoms do not improve within 10 days. Get help right away if:  You have a severe headache.  You have persistent vomiting.  You have pain or swelling around your face or  eyes.  You have vision problems.  You develop confusion.  Your neck is stiff.  You have trouble breathing. This information is not intended to replace advice given to you by your health care provider. Make sure you discuss any questions you have with your health care provider. Document Released: 08/08/2005 Document Revised: 04/03/2016 Document Reviewed: 06/03/2015 Elsevier Interactive Patient Education  2017 Elsevier Inc.    IF you received an x-ray today, you will receive an invoice from [facility]. Please contact [facility] at [phone] with questions or concerns regarding your invoice.   IF you received labwork today, you will receive an invoice from [facility]. Please contact [facility] at [phone] with questions or concerns regarding your invoice.   Our billing staff will not be able to assist you with questions regarding bills from these companies.  You will be contacted with the lab results as soon as they are available. The fastest way to get your results is to activate your My Chart account. Instructions are located on the last page   If you have not heard from [place] regarding the results in 2 weeks, please contact this office.

## 2016-11-10 MED FILL — AMPHETAMINE SALTS 20 MG TAB: 20 | 30 days supply | Qty: 30 | Fill #0

## 2016-11-14 DIAGNOSIS — N951 Menopausal and female climacteric states: Secondary | ICD-10-CM | POA: Diagnosis not present

## 2016-11-17 DIAGNOSIS — N951 Menopausal and female climacteric states: Secondary | ICD-10-CM | POA: Diagnosis not present

## 2016-11-23 DIAGNOSIS — L820 Inflamed seborrheic keratosis: Secondary | ICD-10-CM | POA: Diagnosis not present

## 2016-12-02 MED FILL — ZOLPIDEM TARTRATE 10 MG TAB: 10 | 90 days supply | Qty: 90 | Fill #0

## 2017-01-02 MED FILL — DEXTROAMP-AMPHETAMIN 20 MG: 20 | 30 days supply | Qty: 30 | Fill #0

## 2017-01-10 MED FILL — RIZATRIPTAN 10 MG TABLET: 10 | 90 days supply | Qty: 27 | Fill #2

## 2017-01-25 ENCOUNTER — Encounter: Payer: Self-pay | Admitting: Internal Medicine

## 2017-01-25 ENCOUNTER — Ambulatory Visit (INDEPENDENT_AMBULATORY_CARE_PROVIDER_SITE_OTHER): Payer: 59 | Admitting: Internal Medicine

## 2017-01-25 DIAGNOSIS — F5101 Primary insomnia: Secondary | ICD-10-CM

## 2017-01-25 MED ORDER — CLONAZEPAM 0.5 MG PO TABS: ORAL_TABLET | ORAL | 5 refills | Status: DC

## 2017-01-25 MED FILL — clonazePAM 0.5 MG TABS: 0.5 | 15 days supply | Qty: 30 | Fill #0

## 2017-01-25 NOTE — Progress Notes (Signed)
01/25/17- 56 year old female former smoker referred for sleep problems courtesy of Dr. Helane Rima; not sleeping well and has been using Ambien (no longer helps and taking more than she should). Also, rotated with Lunesta 63m tablets and had to take 4 mg total. Her main problem is not going to sleep at all. 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 Without Ambien she doesn't expect to sleep at all and may sometimes note vivid dreams. She tries to be regular with sleep habits-bedtime around 11 PM, up to gym before work. She is told she snores very little with no parasomnias/movement or behavioral disturbance. OTC sleep medicines were no help at all. No caffeine after lunch. She believes Adderall has completely worn off by 3 PM. Quit a light smoking habit 20 years ago. Occasional alcohol. Patient endorses Epworth sleep score 0/24  Prior to Admission medications   Medication Sig Start Date End Date Taking? Authorizing Provider  amphetamine-dextroamphetamine (ADDERALL) 20 MG tablet Take 20 mg by mouth daily.   Yes [provider]  BIOTIN PO Take 1 tablet by mouth daily.   Yes [provider]  CALCIUM PO Take 1 tablet by mouth daily.   Yes [provider]  Cholecalciferol (VITAMIN D PO) Take 1 tablet by mouth daily.    Yes [provider]  ibuprofen (ADVIL,MOTRIN) 200 MG tablet Take 600 mg by mouth every 6 (six) hours as needed for moderate pain.   Yes [provider]  Multiple Vitamin (MULITIVITAMIN WITH MINERALS) TABS Take 1 tablet by mouth daily.   Yes [provider]  rizatriptan (MAXALT) 10 MG tablet Take 10 mg by mouth daily as needed for migraine. 03/07/16  Yes [provider]   zolpidem (AMBIEN) 10 MG tablet Take 10 mg by mouth at bedtime. 03/07/16  Yes [provider]  clonazePAM (Bobbye [place] 0.5 MG tablet 1 or 2 for sleep as needed 01/25/17   YDeneise Lever MD   Past Medical History:  Diagnosis Date  . Allergy   [place]   ER-/PR-/her2+  . Menopausal and perimenopausal disorder   . Migraines    Past Surgical History:  Procedure Laterality Date  . BREAST SURGERY    . CESAREAN SECTION    . lymph node resection    . MASTECTOMY     Family History  Problem Relation Age of Onset  . Renal cancer Mother 665 . Esophageal cancer Father 81 . Heart disease Father   . Breast cancer Paternal Aunt        dx in her 730s . Uterine cancer Maternal Grandmother 80  .soc ROS-see HPI   "+" = pos Constitutional:    weight loss, night sweats, fevers, chills, fatigue, lassitude. HEENT:    headaches, difficulty swallowing, tooth/dental problems, sore throat,       sneezing, itching, ear ache, nasal congestion, post nasal drip, snoring CV:    chest pain, orthopnea, PND, swelling in lower extremities, anasarca,  dizziness, palpitations Resp:   shortness of breath with exertion or at rest.                productive cough,   non-productive cough, coughing up of blood.              change in color of mucus.  wheezing.   Skin:    rash or lesions. GI:  No-   heartburn, indigestion, abdominal pain, nausea, vomiting, diarrhea,                 change in bowel habits, loss of appetite GU: dysuria, change in color of urine, no urgency or frequency.   flank pain. MS:   joint pain, stiffness, decreased range of motion, back pain. Neuro-     nothing unusual Psych:  change in mood or affect.  depression or anxiety.   memory loss.  OBJ- Physical Exam General- Alert, Oriented, Affect-appropriate, Distress- none acute Skin- rash-none, lesions- none, excoriation- none Lymphadenopathy- none Head-  atraumatic            Eyes- Gross vision intact, PERRLA, conjunctivae and secretions clear            Ears- Hearing, canals-normal            Nose- Clear, no-Septal dev, mucus, polyps, erosion, perforation             Throat- Mallampati II , mucosa clear , drainage- none, tonsils- atrophic Neck- flexible , trachea midline, no stridor , thyroid nl, carotid no bruit Chest - symmetrical excursion , unlabored           Heart/CV- RRR , no murmur , no gallop  , no rub, nl s1 s2                           - JVD- none , edema- none, stasis changes- none, varices- none           Lung- clear to P&A, wheeze- none, cough- none , dullness-none, rub- none           Chest wall- reported bilateral mastectomy with implant reconstruction Abd-  Br/ Gen/ Rectal- Not done, not indicated Extrem- cyanosis- none, clubbing, none, atrophy- none, strength- nl Neuro- grossly intact to observation

## 2017-01-25 NOTE — Patient Instructions (Addendum)
Script for clonazepam 0.5 mg     Try 1 or 2 about 30 minutes before bedtime instead of ambien or [employer] you also try taking melatonin 5-6 mg each night along with the clonazepam to help your brain settle into "bedtime"

## 2017-01-29 DIAGNOSIS — G4700 Insomnia, unspecified: Secondary | ICD-10-CM | POA: Insufficient documentation

## 2017-01-29 NOTE — Assessment & Plan Note (Addendum)
She is likely right that the stress of dealing with breast cancer several years ago, followed by menopause and complicated by rotating shifts left her vulnerable to an insomnia pattern. She has required tolerance to Ambien and Lunesta. I discussed cognitive behavioral therapy as an approach. We discussed management of shift work sleep disturbance. She has been on Adderall a long time and indications are no longer clear except that he did help with depression and shift work issues. We will probably be looking at trying to taper the dose. Plan-try clonazepam up to 1 mg, together with melatonin at bedtime

## 2017-02-07 DIAGNOSIS — N951 Menopausal and female climacteric states: Secondary | ICD-10-CM | POA: Diagnosis not present

## 2017-02-08 DIAGNOSIS — N951 Menopausal and female climacteric states: Secondary | ICD-10-CM | POA: Diagnosis not present

## 2017-02-13 MED FILL — clonazePAM 0.5 MG TABS: 0.5 | 15 days supply | Qty: 30 | Fill #1

## 2017-02-23 MED FILL — DEXTROAMP-AMPHETAMIN 20 MG: 20 | 30 days supply | Qty: 30 | Fill #0

## 2017-02-28 MED FILL — ZOLPIDEM TARTRATE 10 MG TAB: 10 | 90 days supply | Qty: 90 | Fill #0

## 2017-03-06 MED FILL — clonazePAM 0.5 MG TABS: 0.5 | 15 days supply | Qty: 30 | Fill #2

## 2017-03-23 ENCOUNTER — Telehealth: Payer: Self-pay | Admitting: Internal Medicine

## 2017-03-23 NOTE — Telephone Encounter (Signed)
Will forward this message over to KW so that she may follow up with these forms to [place] today.

## 2017-03-24 NOTE — Telephone Encounter (Signed)
Patient is following up on forms for work. Patient need forms completed by Monday 03/27/2017. [phone].

## 2017-03-27 NOTE — Telephone Encounter (Signed)
Please let patient know forms were completed by [place], faxed back to [phone] and original forms are at front for pick up. We have made a copy for our records. Thanks.

## 2017-03-27 NOTE — Telephone Encounter (Signed)
Pt is aware of below message and voiced her understanding. Nothing further needed.  

## 2017-03-27 NOTE — Telephone Encounter (Signed)
Pt requesting forms today 03/27/17.  [facility] and Katie please advise. Thanks.

## 2017-03-29 MED FILL — clonazePAM 0.5 MG TABS: 0.5 | 15 days supply | Qty: 30 | Fill #3

## 2017-04-04 MED FILL — DEXTROAMP-AMPHETAMIN 20 MG: 20 | 30 days supply | Qty: 30 | Fill #0

## 2017-04-14 MED FILL — RIZATRIPTAN 10 MG TABLET: 10 | 90 days supply | Qty: 27 | Fill #3

## 2017-04-19 ENCOUNTER — Telehealth: Payer: Self-pay | Admitting: Internal Medicine

## 2017-04-19 NOTE — Telephone Encounter (Signed)
ATC but the number provided is a fax number, not a phone number.

## 2017-04-20 MED FILL — clonazePAM 0.5 MG TABS: 0.5 | 15 days supply | Qty: 30 | Fill #4

## 2017-04-20 NOTE — Telephone Encounter (Signed)
Per Sharl Ma she has not received anything as of yet.  No contact number for [employer], unable to find online.  Will send to Katie to keep an eye out for these forms.

## 2017-04-21 ENCOUNTER — Encounter: Payer: Self-pay | Admitting: Emergency Medicine

## 2017-04-21 NOTE — Telephone Encounter (Signed)
Spoke with pt. Pt states she is needing a form completed specifying what shifts she can work. Spoke with Dr. Annamaria Boots and he states he has already completed this form and it was already faxed back but apparently not received by [employer] nor HR it has not yet been scanned in the computer, which is delaying the pt's care. Per CY it is ok to write letter and send to where the pt needs. Pt states to call over to HR and speak with Crystal [phone]) and fax the letter to [phone] specifying the shifts the pt can work. This has been done and a confirmation fax was received, pt aware this was being done. Nothing further needed at this time.

## 2017-04-21 NOTE — Telephone Encounter (Signed)
Patient in lobby regarding paperwork - a form I believe was sent directly to [employer] should be with him to complete - pt has to go to work -pr

## 2017-05-15 MED FILL — clonazePAM 0.5 MG TABS: 0.5 | 15 days supply | Qty: 30 | Fill #5

## 2017-05-23 DIAGNOSIS — N951 Menopausal and female climacteric states: Secondary | ICD-10-CM | POA: Diagnosis not present

## 2017-05-26 DIAGNOSIS — N898 Other specified noninflammatory disorders of vagina: Secondary | ICD-10-CM | POA: Diagnosis not present

## 2017-05-26 DIAGNOSIS — R4586 Emotional lability: Secondary | ICD-10-CM | POA: Diagnosis not present

## 2017-05-26 DIAGNOSIS — R6882 Decreased libido: Secondary | ICD-10-CM | POA: Diagnosis not present

## 2017-05-26 DIAGNOSIS — G479 Sleep disorder, unspecified: Secondary | ICD-10-CM | POA: Diagnosis not present

## 2017-05-29 ENCOUNTER — Ambulatory Visit (INDEPENDENT_AMBULATORY_CARE_PROVIDER_SITE_OTHER): Payer: 59 | Admitting: Internal Medicine

## 2017-05-29 ENCOUNTER — Encounter: Payer: Self-pay | Admitting: Internal Medicine

## 2017-05-29 DIAGNOSIS — F5101 Primary insomnia: Secondary | ICD-10-CM

## 2017-05-29 NOTE — Assessment & Plan Note (Signed)
Her intense concern about insomnia is much improved by the reliability of using clonazepam/Ambien. We again discussed appropriate and safe use. I agree with her that she shouldn't take these medicines at bedtime if she is subject to being called in at night. She will discuss this with her employers.

## 2017-05-29 NOTE — Patient Instructions (Signed)
We can continue current meds  Please call as needed 

## 2017-05-29 NOTE — Progress Notes (Signed)
01/25/17- 56 year old female former smoker referred for sleep problems courtesy of Dr. Helane Rima; not sleeping well and has been using Ambien (no longer helps and taking more than she should). Also, rotated with Lunesta 2mg  tablets and had to take 4 mg total. Her main problem is not going to sleep at all. 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 Began noting difficulty initiating and maintaining sleep about 6 years ago. Without Ambien she doesn't expect to sleep at all and may sometimes note vivid dreams. She tries to be regular with sleep habits-bedtime around 11 PM, up to gym before work. She is told she snores very little with no parasomnias/movement or behavioral disturbance. OTC sleep medicines were no help at all. No caffeine after lunch. She believes Adderall has completely worn off by 3 PM. Quit a light smoking habit 20 years ago. Occasional alcohol. Patient endorses Epworth sleep score 0/24  05/29/17- 56 year old female former smoker followed for chronic insomnia, shift work sleep disorder complicated by history breast cancer Taking Adderall 20 mg daily early morning, clonazepam 0.5 mg, melatonin, Maxalt 10 mg as needed Follows for: Insomnia.  pt states she is improved since changing med regimen at last OV.  I had supported her working either fixed dayshift or second shift, but not night shift or irregular schedule. She is concerned now that her job may require third shift call in. She is controlling the insomnia by taking clonazepam 0.5-0.75 mg, with Ambien 10 mg. This works well with no carryover, but she doesn't want to drive in at night after she has taken these meds at bedtime. She still takes Adderall only every week or 2, sodium lingering stimulation from that is not part of the insomnia problem.  ROS-see  HPI   "+" = pos Constitutional:    weight loss, night sweats, fevers, chills, fatigue, lassitude. HEENT:    headaches, difficulty swallowing, tooth/dental problems, sore throat,       sneezing, itching, ear ache, nasal congestion, post nasal drip, snoring CV:    chest pain, orthopnea, PND, swelling in lower extremities, anasarca,                                                             dizziness, palpitations Resp:   shortness of breath with exertion or at rest.                productive cough,   non-productive cough, coughing up of blood.              change in color of mucus.  wheezing.   Skin:    rash or lesions. GI:  No-   heartburn, indigestion, abdominal pain, nausea, vomiting, diarrhea,                 change in bowel habits, loss of appetite GU: dysuria, change in color of urine, no urgency or frequency.   flank pain. MS:   joint pain, stiffness, decreased range of motion, back pain. Neuro-     nothing unusual Psych:  change in mood or affect.  depression or anxiety.   memory loss.  OBJ- Physical Exam General-  Alert, Oriented, Affect-appropriate, Distress- none acute Skin- rash-none, lesions- none, excoriation- none Lymphadenopathy- none Head- atraumatic            Eyes- Gross vision intact, PERRLA, conjunctivae and secretions clear            Ears- Hearing, canals-normal            Nose- Clear, no-Septal dev, mucus, polyps, erosion, perforation             Throat- Mallampati II , mucosa clear , drainage- none, tonsils- atrophic Neck- flexible , trachea midline, no stridor , thyroid nl, carotid no bruit Chest - symmetrical excursion , unlabored           Heart/CV- RRR , no murmur , no gallop  , no rub, nl s1 s2                           - JVD- none , edema- none, stasis changes- none, varices- none           Lung- clear to P&A, wheeze- none, cough- none , dullness-none, rub- none           Chest wall- reported bilateral mastectomy with implant reconstruction Abd-  Br/ Gen/  Rectal- Not done, not indicated Extrem- cyanosis- none, clubbing, none, atrophy- none, strength- nl Neuro- grossly intact to observation

## 2017-05-30 MED FILL — ZOLPIDEM TARTRATE 10 MG TAB: 10 | 90 days supply | Qty: 90 | Fill #0

## 2017-06-05 ENCOUNTER — Other Ambulatory Visit: Payer: Self-pay | Admitting: Internal Medicine

## 2017-06-05 NOTE — Telephone Encounter (Signed)
Ok to refill 6 months total

## 2017-06-05 NOTE — Telephone Encounter (Signed)
CY Please advise on refill. Thanks.  

## 2017-06-06 MED FILL — clonazePAM 0.5 MG TABS: 0.5 | 15 days supply | Qty: 30 | Fill #0

## 2017-06-24 ENCOUNTER — Observation Stay (HOSPITAL_COMMUNITY)
Admission: EM | Admit: 2017-06-24 | Discharge: 2017-06-26 | Disposition: A | Discharge status: Home / Self Care | Payer: PRIVATE HEALTH INSURANCE | Attending: Internal Medicine | Admitting: Internal Medicine

## 2017-06-24 ENCOUNTER — Encounter (HOSPITAL_COMMUNITY): Payer: Self-pay | Admitting: Emergency Medicine

## 2017-06-24 DIAGNOSIS — Z87891 Personal history of nicotine dependence: Secondary | ICD-10-CM | POA: Insufficient documentation

## 2017-06-24 DIAGNOSIS — Z9010 Acquired absence of unspecified breast and nipple: Secondary | ICD-10-CM | POA: Diagnosis not present

## 2017-06-24 DIAGNOSIS — L03114 Cellulitis of left upper limb: Secondary | ICD-10-CM | POA: Diagnosis not present

## 2017-06-24 DIAGNOSIS — Z79899 Other long term (current) drug therapy: Secondary | ICD-10-CM | POA: Insufficient documentation

## 2017-06-24 DIAGNOSIS — Z853 Personal history of malignant neoplasm of breast: Secondary | ICD-10-CM | POA: Insufficient documentation

## 2017-06-24 DIAGNOSIS — G43909 Migraine, unspecified, not intractable, without status migrainosus: Secondary | ICD-10-CM | POA: Diagnosis present

## 2017-06-24 DIAGNOSIS — R2232 Localized swelling, mass and lump, left upper limb: Secondary | ICD-10-CM | POA: Diagnosis present

## 2017-06-24 NOTE — ED Triage Notes (Signed)
Pt from home with complaints of cellulitis. Patient states that she has felt achey all day, but she was watching TV when she noticed redness and swelling to her left forearm. Patient states that the redness has gotten worse since she has gotten here. States this has happened before but the cause was unknown.

## 2017-06-25 ENCOUNTER — Encounter (HOSPITAL_COMMUNITY): Payer: Self-pay | Admitting: *Deleted

## 2017-06-25 ENCOUNTER — Other Ambulatory Visit: Payer: Self-pay

## 2017-06-25 DIAGNOSIS — Z87891 Personal history of nicotine dependence: Secondary | ICD-10-CM | POA: Diagnosis not present

## 2017-06-25 DIAGNOSIS — I890 Lymphedema, not elsewhere classified: Secondary | ICD-10-CM | POA: Diagnosis not present

## 2017-06-25 DIAGNOSIS — L03114 Cellulitis of left upper limb: Secondary | ICD-10-CM | POA: Diagnosis present

## 2017-06-25 DIAGNOSIS — Z9010 Acquired absence of unspecified breast and nipple: Secondary | ICD-10-CM | POA: Diagnosis not present

## 2017-06-25 DIAGNOSIS — Z79899 Other long term (current) drug therapy: Secondary | ICD-10-CM | POA: Diagnosis not present

## 2017-06-25 DIAGNOSIS — Z853 Personal history of malignant neoplasm of breast: Secondary | ICD-10-CM | POA: Diagnosis not present

## 2017-06-25 LAB — I-STAT CG4 LACTIC ACID, ED
LACTIC ACID, VENOUS: 0.67 mmol/L (ref 0.5–1.9)
LACTIC ACID, VENOUS: 1.39 mmol/L (ref 0.5–1.9)

## 2017-06-25 LAB — CBC WITH DIFFERENTIAL/PLATELET
Basophils Absolute: 0 10*3/uL (ref 0.0–0.1)
Basophils Relative: 0 %
EOS ABS: 0.1 10*3/uL (ref 0.0–0.7)
EOS PCT: 1 %
HCT: 39.3 % (ref 36.0–46.0)
Hemoglobin: 13.3 g/dL (ref 12.0–15.0)
LYMPHS ABS: 1.5 10*3/uL (ref 0.7–4.0)
Lymphocytes Relative: 16 %
MCH: 31.5 pg (ref 26.0–34.0)
MCHC: 33.8 g/dL (ref 30.0–36.0)
MCV: 93.1 fL (ref 78.0–100.0)
MONO ABS: 0.9 10*3/uL (ref 0.1–1.0)
MONOS PCT: 9 %
Neutro Abs: 7.1 10*3/uL (ref 1.7–7.7)
Neutrophils Relative %: 74 %
PLATELETS: 279 10*3/uL (ref 150–400)
RBC: 4.22 MIL/uL (ref 3.87–5.11)
RDW: 12.7 % (ref 11.5–15.5)
WBC: 9.5 10*3/uL (ref 4.0–10.5)

## 2017-06-25 LAB — BASIC METABOLIC PANEL
Anion gap: 10 (ref 5–15)
BUN: 16 mg/dL (ref 6–20)
CO2: 25 mmol/L (ref 22–32)
CREATININE: 0.81 mg/dL (ref 0.44–1.00)
Calcium: 9.3 mg/dL (ref 8.9–10.3)
Chloride: 103 mmol/L (ref 101–111)
GFR calc Af Amer: >60 mL/min (ref >60)
GLUCOSE: 112 mg/dL — AB (ref 65–99)
Potassium: 3.9 mmol/L (ref 3.5–5.1)
SODIUM: 138 mmol/L (ref 135–145)

## 2017-06-25 MED ORDER — ENOXAPARIN SODIUM 40 MG/0.4ML ~~LOC~~ SOLN
40.0000 mg | SUBCUTANEOUS | Status: DC
  Filled 2017-06-25: qty 0.4

## 2017-06-25 MED ORDER — CLINDAMYCIN PHOSPHATE 600 MG/50ML IV SOLN
600.0000 mg | Freq: Three times a day (TID) | INTRAVENOUS | Status: DC
  Administered 2017-06-25 – 2017-06-26 (×3): 600 mg via INTRAVENOUS
  Filled 2017-06-25 (×3): qty 50

## 2017-06-25 MED ORDER — DIPHENHYDRAMINE HCL 50 MG/ML IJ SOLN
25.0000 mg | Freq: Once | INTRAMUSCULAR | Status: AC
  Administered 2017-06-25: 25 mg via INTRAVENOUS
  Filled 2017-06-25: qty 1

## 2017-06-25 MED ORDER — AMPHETAMINE-DEXTROAMPHETAMINE 10 MG PO TABS
20.0000 mg | ORAL_TABLET | Freq: Every day | ORAL | Status: DC
  Filled 2017-06-25 (×2): qty 2

## 2017-06-25 MED ORDER — ADULT MULTIVITAMIN W/MINERALS CH
1.0000 | ORAL_TABLET | Freq: Every day | ORAL | Status: DC
  Administered 2017-06-25: 1 via ORAL
  Filled 2017-06-25 (×2): qty 1

## 2017-06-25 MED ORDER — SODIUM CHLORIDE 0.9 % IV BOLUS (SEPSIS)
1000.0000 mL | Freq: Once | INTRAVENOUS | Status: AC
  Administered 2017-06-25: 1000 mL via INTRAVENOUS

## 2017-06-25 MED ORDER — CLINDAMYCIN PHOSPHATE 600 MG/50ML IV SOLN
600.0000 mg | Freq: Once | INTRAVENOUS | Status: AC
  Administered 2017-06-25: 600 mg via INTRAVENOUS
  Filled 2017-06-25: qty 50

## 2017-06-25 MED ORDER — IBUPROFEN 200 MG PO TABS
600.0000 mg | ORAL_TABLET | Freq: Four times a day (QID) | ORAL | Status: DC | PRN
  Administered 2017-06-25: 600 mg via ORAL
  Filled 2017-06-25: qty 3

## 2017-06-25 MED ORDER — RIZATRIPTAN BENZOATE 10 MG PO TABS
10.0000 mg | ORAL_TABLET | Freq: Once | ORAL | Status: AC
  Administered 2017-06-25: 10 mg via ORAL

## 2017-06-25 MED ORDER — CLONAZEPAM 0.5 MG PO TABS
0.5000 mg | ORAL_TABLET | Freq: Every evening | ORAL | Status: DC | PRN
  Administered 2017-06-25: 0.5 mg via ORAL
  Filled 2017-06-25: qty 1

## 2017-06-25 MED ORDER — ZOLPIDEM TARTRATE 10 MG PO TABS
5.0000 mg | ORAL_TABLET | Freq: Every day | ORAL | Status: DC
  Administered 2017-06-25: 5 mg via ORAL
  Filled 2017-06-25: qty 1

## 2017-06-25 NOTE — ED Provider Notes (Signed)
Assumed care from [facility], PA-C at shift change, please refer to her note for full details, but in brief patient is a 56 year old female with a history of lymphedema of the left extremity, secondary to mastectomy from breast cancer 15 years ago, who presents with cellulitis of the left upper extremity.  Patient reports similar symptoms 5 years ago, she was treated with IV antibiotics.  Patient reports feeling achy with a low-grade fever this morning before arrival.  No history of trauma or injury, no evidence of abscess.  Before in the ED labs are unremarkable, lactate normal.  Patient received first dose of IV clindamycin in the ED with minimal improvement in the redness, but obvious persistent cellulitis warmth.  Patient reports previously after 2 doses of antibiotics, her symptoms had almost completely resolved, patient would like to try and avoid admission to the hospital, plan for second dose of IV antibiotics at 9 AM, no improvement patient will need to be admitted for continued antibiotics  Physical Exam  BP 110/67 (BP Location: Left Arm)   Pulse 89   Temp 100.1 F (37.8 C) (Oral)   Resp 18   Ht 5\' 3"  (1.6 m)   Wt 63.5 kg (140 lb)   SpO2 95%   BMI 24.80 kg/m   Physical Exam  Constitutional: She appears well-developed and well-nourished. No distress.  HENT:  Head: Normocephalic and atraumatic.  Eyes: Right eye exhibits no discharge. Left eye exhibits no discharge.  Pulmonary/Chest: Effort normal. No respiratory distress.  Musculoskeletal:  Mild edema of left forearm with circumferential erythema and blanching, erythema extends proximally with faint streaking, no drainage, purulence or abscess  Neurological: She is alert. Coordination normal.  Skin: She is not diaphoretic. There is erythema (left forearm).  Psychiatric: She has a normal mood and affect. Her behavior is normal.  Nursing note and vitals reviewed.   ED Course  Procedures  MDM  Reevaluated patient after second  dose of clindamycin at 0900 morning.  Minimal improvement in redness, but still has redness, swelling and warmth through the full length of the left forearm.  Dr. Ralene Bathe evaluated patient as well.  Patient continues to express that she would like to avoid admission to the hospital, will wait a few more hours to see if redness starts to resolve.  Patient does report some itching over the forearm, will give Benadryl.  Repeat lactic continues to be normal.  Evaluated patient again, no improvement in cellulitis of the left forearm, patient agreeable to admission for continued antibiotics.  Consulted [facility] hospitalist, spoke with Dr. Florencia Reasons who will see and admit the patient.  Patient discussed with Dr. Ralene Bathe, who saw patient as well and agrees with plan.    Jacqlyn Larsen, PA-C 06/25/17 1657    Quintella Reichert, MD 06/26/17 [phone]

## 2017-06-25 NOTE — Discharge Instructions (Signed)
Take Clindamycin as prescribed until finished. Continue with elevation. You may also try ice to limit inflammation. Have the area rechecked by your primary care doctor. Return for new or concerning symptoms.

## 2017-06-25 NOTE — H&P (Signed)
History and Physical  Jodi Nunez VHQ:[id number] DOB: 1960/09/07 DOA: 06/24/2017  Referring physician: EDP PCP: [name], MD   Chief Complaint: left arm Cellulitis  HPI: Jodi Nunez is a 56 y.o. female   H/o breast cancer , history of left arm lymphedema secondary to mastectomy 15 years ago, presented to emergency room due to left arm pain cellulitis.  She had acute onset of L arm cellulitis spreading from her hand this morning. She works as Garment/textile technologist in the ED. Chills and body aches at home.  She reports similar episode 5 years ago that required 2 dose of IV antibiotic in the ED.  ED course: T-max 100.1, otherwise no tachycardia, blood pressure stable, no hypoxia.  Basic lab work unremarkable, no leukocytosis, no lactic acidosis.  She received 1 dose of clindamycin and  EDP recommend hospitalization which she declined. she wants to see if her arm improves after second dose if clindamycin. However, there is not much improvement after second dose of clindamycin, she now agrees to stay in the hospital to continue receiving iv abx treatment. hospitalist called.  She is currently a little drowsy after received a dose of benadryl for itching. She denies cough, no chest pain, no sob, no ab pain, no diarrhea, no dysuria.   Review of Systems:  Detail per HPI, Review of systems are otherwise negative  Past Medical History:  Diagnosis Date  . Allergy   . Breast cancer ([place]) 2006   ER-/PR-/her2+  . Menopausal and perimenopausal disorder   . Migraines    Past Surgical History:  Procedure Laterality Date  . BREAST SURGERY    . CESAREAN SECTION    . lymph node resection    . MASTECTOMY     Social History:  reports that she quit smoking about 20 years ago. Her smoking use included cigarettes. She has a 2.00 pack-year smoking history. she has never used smokeless tobacco. She reports that she drinks alcohol. She reports that she does not use drugs. Patient lives at home & is  able to participate in activities of daily living independently   Allergies  Allergen Reactions  . Penicillins Anaphylaxis    Has patient had a PCN reaction causing immediate rash, facial/tongue/throat swelling, SOB or lightheadedness with hypotension: Yes Has patient had a PCN reaction causing severe rash involving mucus membranes or skin necrosis: No Has patient had a PCN reaction that required hospitalization No Has patient had a PCN reaction occurring within the last 10 years: Yes If all of the above answers are "NO", then may proceed with Cephalosporin use.     Family History  Problem Relation Age of Onset  . Renal cancer Mother 38  . Esophageal cancer Father 46  . Heart disease Father   . Breast cancer Paternal Aunt        dx in her 37s  . Uterine cancer Maternal Grandmother 80      Prior to Admission medications   Medication Sig Start Date End Date Taking? Authorizing Provider  amphetamine-dextroamphetamine (ADDERALL) 20 MG tablet Take 20 mg by mouth daily.   Yes [provider]  BIOTIN PO Take 1 tablet by mouth daily.   Yes [provider]  CALCIUM PO Take 1 tablet by mouth daily.   Yes [provider]  Cholecalciferol (VITAMIN D PO) Take 1 tablet by mouth daily.    Yes [provider]  clonazePAM (KLONOPIN) 0.5 MG tablet TAKE 1 TO 2 TABLETS BY MOUTH DAILY AS NEEDED FOR  SLEEP 06/06/17  Yes Young, Tarri Fuller D, MD  ibuprofen (ADVIL,MOTRIN) 200 MG tablet Take 600 mg by mouth every 6 (six) hours as needed for moderate pain.   Yes [provider]  Multiple Vitamin (MULITIVITAMIN WITH MINERALS) TABS Take 1 tablet by mouth daily.   Yes [provider]  rizatriptan (MAXALT) 10 MG tablet Take 10 mg by mouth daily as needed for migraine. 03/07/16  Yes [provider]  zolpidem (AMBIEN) 10 MG tablet Take 10 mg by mouth at bedtime. 03/07/16  Yes [provider]    Physical Exam: BP 116/87 (BP Location: Right Arm)    Pulse (!) 55   Temp 100.1 F (37.8 C) (Oral)   Resp 10   Ht 5' 3"  (1.6 m)   Wt 63.5 kg (140 lb)   SpO2 98%   BMI 24.80 kg/m   General:  Drowsy, oriented x3 Eyes: PERRL ENT: unremarkable Neck: supple, no JVD Cardiovascular: RRR Respiratory: CTABL Abdomen: soft/ND/ND, positive bowel sounds Skin: no rash Musculoskeletal:  Left arm chronic mild lymphadema changes, erythema almost entire left forearm with erythema strikes extending to left upper arm. Arm is warm to touch, mild tender. No open wound.  Psychiatric: calm/cooperative Neurologic: no focal findings            Labs on Admission:  Basic Metabolic Panel: Recent Labs  Lab 06/25/17 0014  NA 138  K 3.9  CL 103  CO2 25  GLUCOSE 112*  BUN 16  CREATININE 0.81  CALCIUM 9.3   Liver Function Tests: No results for input(s): AST, ALT, ALKPHOS, BILITOT, PROT, ALBUMIN in the last 168 hours. No results for input(s): LIPASE, AMYLASE in the last 168 hours. No results for input(s): AMMONIA in the last 168 hours. CBC: Recent Labs  Lab 06/25/17 0014  WBC 9.5  NEUTROABS 7.1  HGB 13.3  HCT 39.3  MCV 93.1  PLT 279   Cardiac Enzymes: No results for input(s): CKTOTAL, CKMB, CKMBINDEX, TROPONINI in the last 168 hours.  BNP (last 3 results) No results for input(s): BNP in the last 8760 hours.  ProBNP (last 3 results) No results for input(s): PROBNP in the last 8760 hours.  CBG: No results for input(s): GLUCAP in the last 168 hours.  Radiological Exams on Admission: No results found.    Assessment/Plan Present on Admission: . Cellulitis of left arm  Cellulitis of left arm: with h/o chronic left arm lymphedema -Nonpurulent, no open wound. No leukocytosis, t max 100.1. -She is allergic to pcn ( report anaphylaxis reaction) -Will continue clindamycin iv, check esr/crp, she declined left arm imaging.  H/o invasive ductal carcinoma of the brest 10yr ago  This was treated with bilateral mastectomy with TRAM,  chemotherapy and radiation.  The tumor was ER-/PR-/Her2+.  DVT prophylaxis: lovenox  Consultants: none  Code Status: full   Family Communication:  Patient   Disposition Plan: med surg obs  Time spent: 586ms  Chandni Gagan MD, PhD [facility] Pager [phone] 7PM-7AM, please contact night-coverage at [email or web address], password [email or web address]

## 2017-06-25 NOTE — ED Provider Notes (Signed)
[facility] DEPT Provider Note   CSN: [id number] Arrival date & time: 06/24/17  2205     History   Chief Complaint Chief Complaint  Patient presents with  . Cellulitis    HPI Jodi Nunez is a 56 y.o. female.  57 y/o female with hx of left upper extremity lymphedema secondary to mastectomy from breast cancer 15 years ago presents to the ED for c/o cellulitis to her left upper extremity.  She reports a history of similar symptoms 5 years ago for which she was seen in the emergency department.  She reports feeling "achy" this morning with a low-grade fever.  She took ibuprofen for this.  Maximum temperature 99.13F.  Patient then began to note redness to her left forearm which began worsening this evening.  No history of trauma or injury. No drainage, numbness, paresthesias.      Past Medical History:  Diagnosis Date  . Allergy   . Breast cancer ([place]) 2006   ER-/PR-/her2+  . Menopausal and perimenopausal disorder   . Migraines     Patient Active Problem List   Diagnosis Date Noted  . Insomnia 01/29/2017    Past Surgical History:  Procedure Laterality Date  . BREAST SURGERY    . CESAREAN SECTION    . lymph node resection    . MASTECTOMY      OB History    No data available       Home Medications    Prior to Admission medications   Medication Sig Start Date End Date Taking? Authorizing Provider  amphetamine-dextroamphetamine (ADDERALL) 20 MG tablet Take 20 mg by mouth daily.   Yes [provider]  BIOTIN PO Take 1 tablet by mouth daily.   Yes [provider]  CALCIUM PO Take 1 tablet by mouth daily.   Yes [provider]  Cholecalciferol (VITAMIN D PO) Take 1 tablet by mouth daily.    Yes [provider]  clonazePAM (KLONOPIN) 0.5 MG tablet TAKE 1 TO 2 TABLETS BY MOUTH DAILY AS NEEDED FOR SLEEP 06/06/17  Yes Young, Clinton D, MD  ibuprofen (ADVIL,MOTRIN) 200 MG tablet Take 600 mg by mouth  every 6 (six) hours as needed for moderate pain.   Yes [provider]  Multiple Vitamin (MULITIVITAMIN WITH MINERALS) TABS Take 1 tablet by mouth daily.   Yes [provider]  rizatriptan (MAXALT) 10 MG tablet Take 10 mg by mouth daily as needed for migraine. 03/07/16  Yes [provider]  zolpidem (AMBIEN) 10 MG tablet Take 10 mg by mouth at bedtime. 03/07/16  Yes [provider]    Family History Family History  Problem Relation Age of Onset  . Renal cancer Mother 23  . Esophageal cancer Father 50  . Heart disease Father   . Breast cancer Paternal Aunt        dx in her 38s  . Uterine cancer Maternal Grandmother 80    Social History Social History   Tobacco Use  . Smoking status: Former Smoker    Packs/day: 0.20    Years: 10.00    Pack years: 2.00    Types: Cigarettes    Last attempt to quit: 01/25/1997    Years since quitting: 20.4  . Smokeless tobacco: Never Used  Substance Use Topics  . Alcohol use: Yes    Comment: occasional  . Drug use: No     Allergies   Penicillins   Review of Systems Review of Systems Ten systems reviewed  and are negative for acute change, except as noted in the HPI.    Physical Exam Updated Vital Signs BP 110/67 (BP Location: Left Arm)   Pulse 89   Temp 100.1 F (37.8 C) (Oral)   Resp 18   Ht 5' 3"  (1.6 m)   Wt 63.5 kg (140 lb)   SpO2 95%   BMI 24.80 kg/m   Physical Exam  Constitutional: She is oriented to person, place, and time. She appears well-developed and well-nourished. No distress.  Nontoxic appearing and in NAD  HENT:  Head: Normocephalic and atraumatic.  Eyes: Conjunctivae and EOM are normal. No scleral icterus.  Neck: Normal range of motion.  Cardiovascular: Normal rate, regular rhythm and intact distal pulses.   Distal radial pulse 2+ in the left upper extremity.  Pulmonary/Chest: Effort normal. No respiratory distress.  Musculoskeletal: Normal range of motion.  Mild edema  noted to the left forearm with erythema, blanching. Erythema extends proximally with faint streaking to the proximal LUE.  Neurological: She is alert and oriented to person, place, and time. She exhibits normal muscle tone. Coordination normal.  Skin: Skin is warm and dry. She is not diaphoretic. There is erythema. No pallor.  See imaging below.  Psychiatric: She has a normal mood and affect. Her behavior is normal.  Nursing note and vitals reviewed.         ED Treatments / Results  Labs (all labs ordered are listed, but only abnormal results are displayed) Labs Reviewed  BASIC METABOLIC PANEL - Abnormal; Notable for the following components:      Result Value   Glucose, Bld 112 (*)    All other components within normal limits  CBC WITH DIFFERENTIAL/PLATELET  I-STAT CG4 LACTIC ACID, ED    EKG  EKG Interpretation None       Radiology No results found.  Procedures Procedures (including critical care time)  Medications Ordered in ED Medications  clindamycin (CLEOCIN) IVPB 600 mg (not administered)  sodium chloride 0.9 % bolus 1,000 mL (0 mLs Intravenous Stopped 06/25/17 0104)  clindamycin (CLEOCIN) IVPB 600 mg (0 mg Intravenous Stopped 06/25/17 0135)  sodium chloride 0.9 % bolus 1,000 mL (0 mLs Intravenous Stopped 06/25/17 0230)     Initial Impression / Assessment and Plan / ED Course  I have reviewed the triage vital signs and the nursing notes.  Pertinent labs & imaging results that were available during my care of the patient were reviewed by me and considered in my medical decision making (see chart for details).     56 year old female with hx of left upper extremity lymphedema secondary to mastectomy from breast cancer 15 years ago presents to the emergency department for left upper extremity cellulitis which began yesterday afternoon.  No known inciting cause for symptoms.  Patient afebrile with reassuring vital signs and laboratory workup.  No leukocytosis.   Lactic acid normal.  Patient with similar symptoms in 2013.  She was treated in the emergency department observation unit with multiple doses of IV antibiotics with improvement in her erythema.  She has had some mild improvement to the degree of redness of her left forearm, but cellulitis and obvious warmth persist.    Plan to dose with additional round of IV antibiotics but do at 0900.  If no improvement following subsequent dose of antibiotics, observation admission recommended to the patient.  Otherwise, I do not believe outpatient follow-up is unreasonable.  Patient signed out to Benedetto Goad, PA-C at shift chang who will assume care  and disposition appropriately.   Final Clinical Impressions(s) / ED Diagnoses   Final diagnoses:  Cellulitis of left forearm    New Prescriptions This SmartLink is deprecated. Use AVSMEDLIST instead to display the medication list for a patient.   Antonietta Breach, PA-C 06/25/17 0600    Drenda Freeze, MD 06/25/17 [phone]

## 2017-06-25 NOTE — ED Notes (Signed)
Left arm cellulitis marking done.

## 2017-06-25 NOTE — ED Notes (Signed)
Patient ambulatory to restroom with steady gait.

## 2017-06-26 DIAGNOSIS — G43909 Migraine, unspecified, not intractable, without status migrainosus: Secondary | ICD-10-CM | POA: Diagnosis not present

## 2017-06-26 DIAGNOSIS — Z9010 Acquired absence of unspecified breast and nipple: Secondary | ICD-10-CM | POA: Diagnosis not present

## 2017-06-26 DIAGNOSIS — L03114 Cellulitis of left upper limb: Secondary | ICD-10-CM | POA: Diagnosis not present

## 2017-06-26 DIAGNOSIS — Z87891 Personal history of nicotine dependence: Secondary | ICD-10-CM | POA: Diagnosis not present

## 2017-06-26 DIAGNOSIS — Z853 Personal history of malignant neoplasm of breast: Secondary | ICD-10-CM | POA: Diagnosis not present

## 2017-06-26 DIAGNOSIS — Z79899 Other long term (current) drug therapy: Secondary | ICD-10-CM | POA: Diagnosis not present

## 2017-06-26 LAB — CBC
HCT: 37.6 % (ref 36.0–46.0)
Hemoglobin: 12.7 g/dL (ref 12.0–15.0)
MCH: 31.7 pg (ref 26.0–34.0)
MCHC: 33.8 g/dL (ref 30.0–36.0)
MCV: 93.8 fL (ref 78.0–100.0)
PLATELETS: 266 10*3/uL (ref 150–400)
RBC: 4.01 MIL/uL (ref 3.87–5.11)
RDW: 12.9 % (ref 11.5–15.5)
WBC: 5.7 10*3/uL (ref 4.0–10.5)

## 2017-06-26 LAB — C-REACTIVE PROTEIN: CRP: 4.9 mg/dL — ABNORMAL HIGH (ref <1.0)

## 2017-06-26 LAB — LACTIC ACID, PLASMA: Lactic Acid, Venous: 1.1 mmol/L (ref 0.5–1.9)

## 2017-06-26 LAB — BASIC METABOLIC PANEL
Anion gap: 7 (ref 5–15)
BUN: 11 mg/dL (ref 6–20)
CALCIUM: 9.1 mg/dL (ref 8.9–10.3)
CO2: 25 mmol/L (ref 22–32)
CREATININE: 0.64 mg/dL (ref 0.44–1.00)
Chloride: 113 mmol/L — ABNORMAL HIGH (ref 101–111)
GFR calc Af Amer: >60 mL/min (ref >60)
GLUCOSE: 112 mg/dL — AB (ref 65–99)
Potassium: 4.1 mmol/L (ref 3.5–5.1)
SODIUM: 145 mmol/L (ref 135–145)

## 2017-06-26 LAB — HIV ANTIBODY (ROUTINE TESTING W REFLEX): HIV Screen 4th Generation wRfx: NONREACTIVE

## 2017-06-26 LAB — SEDIMENTATION RATE: SED RATE: 16 mm/h (ref 0–22)

## 2017-06-26 MED ORDER — CLINDAMYCIN HCL 300 MG PO CAPS: 300.0000 mg | ORAL_CAPSULE | Freq: Four times a day (QID) | ORAL | 0 refills | Status: AC

## 2017-06-26 MED ORDER — SODIUM CHLORIDE 0.45 % IV SOLN: INTRAVENOUS | Status: DC

## 2017-06-26 MED ORDER — SODIUM CHLORIDE 0.45 % IV SOLN
INTRAVENOUS | Status: DC
  Administered 2017-06-26: 10:00:00 via INTRAVENOUS

## 2017-06-26 MED FILL — CLINDAMYCIN HCL 300 MG CAPS: 300 | 6 days supply | Qty: 24 | Fill #0

## 2017-06-26 NOTE — Progress Notes (Signed)
Patient refused to have HIV and Lactic Acid lab drawn.  Patient educated and understands.

## 2017-06-26 NOTE — Discharge Summary (Signed)
Physician Discharge Summary  Jodi Nunez ZOX:[id number] DOB: 10-19-1960 DOA: 06/24/2017  PCP: [name], MD  Admit date: 06/24/2017 Discharge date: 06/26/2017  Time spent: 45 minutes  Recommendations for Outpatient Follow-up:  1. Follow-up with [name], MD as scheduled. On follow-up patient's left upper extremity cellulitis would need to be reassessed.   Discharge Diagnoses:  Principal Problem:   Cellulitis of left arm Active Problems:   Migraines   Discharge Condition: Stable and improved  Diet recommendation: Regular  Filed Weights   06/24/17 2357 06/26/17 0511  Weight: 63.5 kg (140 lb) 68.5 kg (151 lb 0.2 oz)    History of present illness:  Per Dr.Xu Varney Biles. is a 56 y.o. female   H/o breast cancer , history of left arm lymphedema secondary to mastectomy 15 years ago, presented to emergency room due to left arm pain cellulitis. She had acute onset of L arm cellulitis spreading from her hand this morning. She works as Garment/textile technologist in the ED. Chills and body aches at home.  She reported similar episode 5 years ago that required 2 dose of IV antibiotic in the ED.  ED course: T-max 100.1, otherwise no tachycardia, blood pressure stable, no hypoxia.  Basic lab work unremarkable, no leukocytosis, no lactic acidosis.  She received 1 dose of clindamycin and  EDP recommend hospitalization which she declined. she wants to see if her arm improves after second dose if clindamycin. However, there is not much improvement after second dose of clindamycin, she now agrees to stay in the hospital to continue receiving iv abx treatment. hospitalist called.  She is currently a little drowsy after received a dose of benadryl for itching. She denies cough, no chest pain, no sob, no ab pain, no diarrhea, no dysuria.      Hospital Course:  #1 left upper extremity cellulitis Patient with history of chronic left upper extremity lymphedema presented with a left upper  extremity cellulitis which was non-purulent, no open wounds. Patient had a MAXIMUM TEMPERATURE of 100.1. Patient did not have any leukocytosis. Patient declined any left upper extremity imaging. CRP obtained was 4.9. Patient had a normal lactic acid level. Due to allergy to penicillin patient placed on IV clindamycin with significant clinical improvement. Patient be discharged home on oral clindamycin 300 mg by mouth every 6 hours 6 days to complete a one-week was of antibiotic treatment. Outpatient follow-up with PCP.  #2 history of invasive ductal carcinoma of the breast 15 years ago -This was treated with bilateral mastectomy with TRAM, chemotherapy and radiation. Tumor was ER negative PR negative HER-2 positive.  #3 migraines Stable.  Procedures:  None  Consultations:  None  Discharge Exam: Vitals:   06/25/17 2124 06/26/17 0511  BP: 127/82 (!) 95/55  Pulse: 75 70  Resp: 16 16  Temp: 98.4 F (36.9 C) 98.4 F (36.9 C)  SpO2: 98% 97%    General: NAD Cardiovascular: RRR Respiratory: CTAB  Discharge Instructions   Discharge Instructions    Diet general   Complete by:  As directed    Increase activity slowly   Complete by:  As directed      Current Discharge Medication List    START taking these medications   Details  clindamycin (CLEOCIN) 300 MG capsule Take 1 capsule (300 mg total) 4 (four) times daily for 6 days by mouth. Qty: 24 capsule, Refills: 0      CONTINUE these medications which have NOT CHANGED   Details  amphetamine-dextroamphetamine (ADDERALL) 20 MG  tablet Take 20 mg by mouth daily.    BIOTIN PO Take 1 tablet by mouth daily.    CALCIUM PO Take 1 tablet by mouth daily.    Cholecalciferol (VITAMIN D PO) Take 1 tablet by mouth daily.     clonazePAM (KLONOPIN) 0.5 MG tablet TAKE 1 TO 2 TABLETS BY MOUTH DAILY AS NEEDED FOR SLEEP Qty: 30 tablet, Refills: 5    ibuprofen (ADVIL,MOTRIN) 200 MG tablet Take 600 mg by mouth every 6 (six) hours as  needed for moderate pain.    Multiple Vitamin (MULITIVITAMIN WITH MINERALS) TABS Take 1 tablet by mouth daily.    rizatriptan (MAXALT) 10 MG tablet Take 10 mg by mouth daily as needed for migraine. Refills: 3    zolpidem (AMBIEN) 10 MG tablet Take 10 mg by mouth at bedtime. Refills: 1       Allergies  Allergen Reactions  . Penicillins Anaphylaxis    Has patient had a PCN reaction causing immediate rash, facial/tongue/throat swelling, SOB or lightheadedness with hypotension: Yes Has patient had a PCN reaction causing severe rash involving mucus membranes or skin necrosis: No Has patient had a PCN reaction that required hospitalization No Has patient had a PCN reaction occurring within the last 10 years: Yes If all of the above answers are "NO", then may proceed with Cephalosporin use.    Follow-up Information    [name], MD In 2 days.   Specialty:  Obstetrics and Gynecology Why:  For re-check Contact information: [place] [address] [phone]        [facility] DEPT.   Specialty:  Emergency Medicine Why:  As needed, if symptoms worsen Contact information: [place] [id number] [place] [phone]           The results of significant diagnostics from this hospitalization (including imaging, microbiology, ancillary and laboratory) are listed below for reference.    Significant Diagnostic Studies: No results found.  Microbiology: No results found for this or any previous visit (from the past 240 hour(s)).   Labs: Basic Metabolic Panel: Recent Labs  Lab 06/25/17 0014 06/26/17 0439  NA 138 145  K 3.9 4.1  CL 103 113*  CO2 25 25  GLUCOSE 112* 112*  BUN 16 11  CREATININE 0.81 0.64  CALCIUM 9.3 9.1   Liver Function Tests: No results for input(s): AST, ALT, ALKPHOS, BILITOT, PROT, ALBUMIN in the last 168 hours. No results for input(s):  LIPASE, AMYLASE in the last 168 hours. No results for input(s): AMMONIA in the last 168 hours. CBC: Recent Labs  Lab 06/25/17 0014 06/26/17 0439  WBC 9.5 5.7  NEUTROABS 7.1  --   HGB 13.3 12.7  HCT 39.3 37.6  MCV 93.1 93.8  PLT 279 266   Cardiac Enzymes: No results for input(s): CKTOTAL, CKMB, CKMBINDEX, TROPONINI in the last 168 hours. BNP: BNP (last 3 results) No results for input(s): BNP in the last 8760 hours.  ProBNP (last 3 results) No results for input(s): PROBNP in the last 8760 hours.  CBG: No results for input(s): GLUCAP in the last 168 hours.     SignedIrine Seal MD.  [facility] 06/26/2017, 11:41 AM

## 2017-06-26 NOTE — Progress Notes (Signed)
Patient discharged to home, all discharge medications and instructions reviewed and questions answered.  Patient to be assisted to vehicle by wheelchair.  

## 2017-07-03 MED FILL — clonazePAM 0.5 MG TABS: 0.5 | 15 days supply | Qty: 30 | Fill #1

## 2017-07-06 DIAGNOSIS — Z01419 Encounter for gynecological examination (general) (routine) without abnormal findings: Secondary | ICD-10-CM | POA: Diagnosis not present

## 2017-07-06 DIAGNOSIS — Z6826 Body mass index (BMI) 26.0-26.9, adult: Secondary | ICD-10-CM | POA: Diagnosis not present

## 2017-07-17 MED FILL — RIZATRIPTAN 10 MG TABLET: 10 | 27 days supply | Qty: 27 | Fill #0

## 2017-07-21 MED FILL — AMPHETAMINE-DEXTRO 20MG: 20 | 30 days supply | Qty: 30 | Fill #0

## 2017-07-26 MED FILL — clonazePAM 0.5 MG TABS: 0.5 | 15 days supply | Qty: 30 | Fill #2

## 2017-08-25 MED FILL — clonazePAM 0.5 MG TABS: 0.5 | 15 days supply | Qty: 30 | Fill #3

## 2017-08-29 MED FILL — ZOLPIDEM TARTRATE 10 MG TAB: 10 | 90 days supply | Qty: 90 | Fill #0

## 2017-08-29 MED FILL — AMPHETAMINE-DEXTRO 20MG: 20 | 30 days supply | Qty: 30 | Fill #0

## 2017-09-18 DIAGNOSIS — R4586 Emotional lability: Secondary | ICD-10-CM | POA: Diagnosis not present

## 2017-09-18 DIAGNOSIS — R6882 Decreased libido: Secondary | ICD-10-CM | POA: Diagnosis not present

## 2017-09-18 DIAGNOSIS — N898 Other specified noninflammatory disorders of vagina: Secondary | ICD-10-CM | POA: Diagnosis not present

## 2017-09-18 DIAGNOSIS — G479 Sleep disorder, unspecified: Secondary | ICD-10-CM | POA: Diagnosis not present

## 2017-09-18 MED FILL — clonazePAM 0.5 MG TABS: 0.5 | 15 days supply | Qty: 30 | Fill #4

## 2017-09-21 DIAGNOSIS — R6882 Decreased libido: Secondary | ICD-10-CM | POA: Diagnosis not present

## 2017-09-21 DIAGNOSIS — N951 Menopausal and female climacteric states: Secondary | ICD-10-CM | POA: Diagnosis not present

## 2017-09-21 DIAGNOSIS — G479 Sleep disorder, unspecified: Secondary | ICD-10-CM | POA: Diagnosis not present

## 2017-09-21 DIAGNOSIS — N898 Other specified noninflammatory disorders of vagina: Secondary | ICD-10-CM | POA: Diagnosis not present

## 2017-10-12 MED FILL — clonazePAM 0.5 MG TABS: 0.5 | 15 days supply | Qty: 30 | Fill #5

## 2017-10-13 MED FILL — RIZATRIPTAN BENZOATE 10 MG: 10 | 90 days supply | Qty: 27 | Fill #1

## 2017-10-31 ENCOUNTER — Other Ambulatory Visit: Payer: Self-pay | Admitting: Internal Medicine

## 2017-10-31 NOTE — Telephone Encounter (Signed)
CY Please advise on refill. Thanks.  

## 2017-10-31 NOTE — Telephone Encounter (Signed)
Ok to refill as before 

## 2017-11-01 MED FILL — clonazePAM 0.5 MG TABS: 0.5 | 15 days supply | Qty: 30 | Fill #0

## 2017-11-06 DIAGNOSIS — Z1211 Encounter for screening for malignant neoplasm of colon: Secondary | ICD-10-CM | POA: Diagnosis not present

## 2017-11-06 DIAGNOSIS — K6389 Other specified diseases of intestine: Secondary | ICD-10-CM | POA: Diagnosis not present

## 2017-11-06 DIAGNOSIS — Z86010 Personal history of colonic polyps: Secondary | ICD-10-CM | POA: Diagnosis not present

## 2017-11-15 MED FILL — ESZOPICLONE 2 MG TAB: 2 | 30 days supply | Qty: 30 | Fill #0

## 2017-11-16 MED FILL — DEXTROAMP-AMPHETAMIN 20 MG: 20 | 30 days supply | Qty: 30 | Fill #0

## 2017-11-21 MED FILL — clonazePAM 0.5 MG TABS: 0.5 | 15 days supply | Qty: 30 | Fill #1

## 2017-12-06 DIAGNOSIS — H5213 Myopia, bilateral: Secondary | ICD-10-CM | POA: Diagnosis not present

## 2017-12-06 MED FILL — SULFAMETHOXAZOLE-TMP DS TAB: 800-160 | 7 days supply | Qty: 14 | Fill #0

## 2017-12-06 MED FILL — NEO/POLY/DEXAMET EYE OINT: 3.5-10000-0 | 10 days supply | Qty: 4 | Fill #0

## 2017-12-06 MED FILL — METHYLPREDNISOLONE 4 MG TAB: 4 | 6 days supply | Qty: 21 | Fill #0

## 2017-12-18 MED FILL — clonazePAM 0.5 MG TABS: 0.5 | 15 days supply | Qty: 30 | Fill #2

## 2017-12-25 MED FILL — ZOLPIDEM TARTRATE 10 MG TAB: 10 | 90 days supply | Qty: 90 | Fill #0

## 2018-01-11 DIAGNOSIS — L658 Other specified nonscarring hair loss: Secondary | ICD-10-CM | POA: Diagnosis not present

## 2018-01-23 MED FILL — clonazePAM 0.5 MG TABS: 0.5 | 15 days supply | Qty: 30 | Fill #3

## 2018-01-26 MED FILL — RIZATRIPTAN BENZOATE 10 MG: 10 | 90 days supply | Qty: 27 | Fill #2

## 2018-02-06 DIAGNOSIS — N898 Other specified noninflammatory disorders of vagina: Secondary | ICD-10-CM | POA: Diagnosis not present

## 2018-02-06 DIAGNOSIS — R6882 Decreased libido: Secondary | ICD-10-CM | POA: Diagnosis not present

## 2018-02-06 DIAGNOSIS — N951 Menopausal and female climacteric states: Secondary | ICD-10-CM | POA: Diagnosis not present

## 2018-02-06 DIAGNOSIS — G479 Sleep disorder, unspecified: Secondary | ICD-10-CM | POA: Diagnosis not present

## 2018-02-07 DIAGNOSIS — N951 Menopausal and female climacteric states: Secondary | ICD-10-CM | POA: Diagnosis not present

## 2018-02-07 DIAGNOSIS — R5383 Other fatigue: Secondary | ICD-10-CM | POA: Diagnosis not present

## 2018-02-07 DIAGNOSIS — N898 Other specified noninflammatory disorders of vagina: Secondary | ICD-10-CM | POA: Diagnosis not present

## 2018-02-07 DIAGNOSIS — G479 Sleep disorder, unspecified: Secondary | ICD-10-CM | POA: Diagnosis not present

## 2018-02-14 MED FILL — DEXTROAMP-AMPHETAMIN 20 MG: 20 | 30 days supply | Qty: 30 | Fill #0

## 2018-02-26 MED FILL — clonazePAM 0.5 MG TABS: 0.5 | 15 days supply | Qty: 30 | Fill #4

## 2018-03-23 MED FILL — clonazePAM 0.5 MG TABS: 0.5 | 15 days supply | Qty: 30 | Fill #5

## 2018-03-23 MED FILL — ZOLPIDEM TARTRATE 10 MG TAB: 10 | 90 days supply | Qty: 90 | Fill #0

## 2018-04-04 MED FILL — AMPHET-DEXTROAMP 20 MG TAB: 20 | 30 days supply | Qty: 30 | Fill #0

## 2018-04-05 ENCOUNTER — Other Ambulatory Visit: Payer: Self-pay

## 2018-04-05 ENCOUNTER — Encounter: Payer: Self-pay | Admitting: Family Medicine

## 2018-04-05 ENCOUNTER — Ambulatory Visit (INDEPENDENT_AMBULATORY_CARE_PROVIDER_SITE_OTHER): Payer: 59 | Admitting: Family Medicine

## 2018-04-05 VITALS — BP 111/69 | HR 68 | Temp 97.8°F | Ht 63.5 in | Wt 145.6 lb

## 2018-04-05 DIAGNOSIS — J019 Acute sinusitis, unspecified: Secondary | ICD-10-CM

## 2018-04-05 DIAGNOSIS — J309 Allergic rhinitis, unspecified: Secondary | ICD-10-CM

## 2018-04-05 MED ORDER — DOXYCYCLINE HYCLATE 100 MG PO TABS: 100.0000 mg | ORAL_TABLET | Freq: Two times a day (BID) | ORAL | 0 refills | Status: DC

## 2018-04-05 MED FILL — DOXYCYCLINE HYCLATE 100 MG: 100 | 10 days supply | Qty: 20 | Fill #0

## 2018-04-05 NOTE — Progress Notes (Signed)
Subjective:  By signing my name below, I, [name], attest that this documentation has been prepared under the direction and in the presence of [name], MD Electronically Signed: Ladene Artist, ED Scribe 04/05/2018 at 11:50 AM.   Patient ID: Jodi Biles., female    DOB: 11/10/1960, 57 y.o.   MRN: [id number]  Chief Complaint  Patient presents with  . Sinus Problem    3 days   HPI Jodi Nunez is a 57 y.o. female who presents to Primary Care at [facility] complaining of sinus pressure x 3 days. Pt states that she went into her safe 4 days ago and was exposed to a lot of dust. States she woke the next morning with sinus pressure, HA, eye puffiness, nasal congestion, postnasal drip, some hoarseness yesterday and minimal green colored nasal discharge yesterday. She has tried [employer] twice, OTC sinus decongestant, ibuprofen, drinking plenty of water. Denies fever, sick contacts. She does express concern as she is preparing to travel outside of the country for 13 days in 1 wk. Allergy to penicillin (anaphylaxis). She has taken z-pak in the past.  Patient Active Problem List   Diagnosis Date Noted  . Migraines 06/26/2017  . Cellulitis of left arm 06/25/2017  . Insomnia 01/29/2017   Past Medical History:  Diagnosis Date  . Allergy   . Breast cancer ([place]) 2006   ER-/PR-/her2+  . Menopausal and perimenopausal disorder   . Migraines    Past Surgical History:  Procedure Laterality Date  . BREAST SURGERY    . CESAREAN SECTION    . lymph node resection    . MASTECTOMY     Allergies  Allergen Reactions  . Penicillins Anaphylaxis    Has patient had a PCN reaction causing immediate rash, facial/tongue/throat swelling, SOB or lightheadedness with hypotension: Yes Has patient had a PCN reaction causing severe rash involving mucus membranes or skin necrosis: No Has patient had a PCN reaction that required hospitalization No Has patient had a PCN reaction occurring within the  last 10 years: Yes If all of the above answers are "NO", then may proceed with Cephalosporin use.    Prior to Admission medications   Medication Sig Start Date End Date Taking? Authorizing Provider  amphetamine-dextroamphetamine (ADDERALL) 20 MG tablet Take 20 mg by mouth daily.    [provider]  BIOTIN PO Take 1 tablet by mouth daily.    [provider]  CALCIUM PO Take 1 tablet by mouth daily.    [provider]  Cholecalciferol (VITAMIN D PO) Take 1 tablet by mouth daily.     [provider]  clonazePAM (KLONOPIN) 0.5 MG tablet TAKE 1 TO 2 TABLETS BY MOUTH ONCE DAILY AS NEEDED FOR SLEEP 10/31/17   Young, Tarri Fuller D, MD  ibuprofen (ADVIL,MOTRIN) 200 MG tablet Take 600 mg by mouth every 6 (six) hours as needed for moderate pain.    [provider]  Multiple Vitamin (MULITIVITAMIN WITH MINERALS) TABS Take 1 tablet by mouth daily.    [provider]  rizatriptan (MAXALT) 10 MG tablet Take 10 mg by mouth daily as needed for migraine. 03/07/16   [provider]  zolpidem (AMBIEN) 10 MG tablet Take 10 mg by mouth at bedtime. 03/07/16   [provider]   Social History   Socioeconomic History  . Marital status: Married    Spouse name: Not on file  . Number of children: Not on file  . Years of education: Not on  file  . Highest education level: Not on file  Occupational History  . Not on file  Social Needs  . Financial resource strain: Not on file  . Food insecurity:    Worry: Not on file    Inability: Not on file  . Transportation needs:    Medical: Not on file    Non-medical: Not on file  Tobacco Use  . Smoking status: Former Smoker    Packs/day: 0.20    Years: 10.00    Pack years: 2.00    Types: Cigarettes    Last attempt to quit: 01/25/1997    Years since quitting: 21.2  . Smokeless tobacco: Never Used  Substance and Sexual Activity  . Alcohol use: Yes    Comment: occasional  . Drug use: No  . Sexual  activity: Not on file  Lifestyle  . Physical activity:    Days per week: Not on file    Minutes per session: Not on file  . Stress: Not on file  Relationships  . Social connections:    Talks on phone: Not on file    Gets together: Not on file    Attends religious service: Not on file    Active member of club or organization: Not on file    Attends meetings of clubs or organizations: Not on file    Relationship status: Not on file  . Intimate partner violence:    Fear of current or ex partner: Not on file    Emotionally abused: Not on file    Physically abused: Not on file    Forced sexual activity: Not on file  Other Topics Concern  . Not on file  Social History Narrative  . Not on file   Review of Systems  Constitutional: Negative for fever.  HENT: Positive for congestion, postnasal drip, sinus pressure and voice change.   Eyes:       + eye puffiness  Neurological: Positive for headaches.      Objective:   Physical Exam  Constitutional: She is oriented to person, place, and time. She appears well-developed and well-nourished. No distress.  HENT:  Head: Normocephalic and atraumatic.  Right Ear: Hearing, tympanic membrane, external ear and ear canal normal.  Left Ear: Hearing, tympanic membrane, external ear and ear canal normal.  Nose: Mucosal edema (minimal, R greater than L) present. Left sinus exhibits maxillary sinus tenderness and frontal sinus tenderness.  Mouth/Throat: Oropharynx is clear and moist. No oropharyngeal exudate.  Nose: no active discharge  Eyes: Pupils are equal, round, and reactive to light. Conjunctivae and EOM are normal.  Cardiovascular: Normal rate, regular rhythm, normal heart sounds and intact distal pulses.  No murmur heard. Pulmonary/Chest: Effort normal and breath sounds normal. No respiratory distress. She has no wheezes. She has no rhonchi.  Lymphadenopathy:    She has no cervical adenopathy.  Neurological: She is alert and oriented to  person, place, and time.  Skin: Skin is warm and dry. No rash noted.  Psychiatric: She has a normal mood and affect. Her behavior is normal.  Vitals reviewed.  Vitals:   04/05/18 1137  BP: 111/69  Pulse: 68  Temp: 97.8 F (36.6 C)  TempSrc: Oral  SpO2: 97%  Weight: 145 lb 9.6 oz (66 kg)  Height: 5' 3.5" (1.613 m)      Assessment & Plan:    Buffie Herne. is a 57 y.o. female Acute sinusitis, recurrence not specified, unspecified location - Plan: doxycycline (VIBRA-TABS) 100 MG tablet  Allergic rhinitis, unspecified seasonality, unspecified trigger  Suspected viral vs more likley allergic cause at present.   -Symptomatic care discussed with flonase, saline NS, and short term afrin if needed including with flying.   -with upcoming travel, Rx for doxyccyline given if not improving for bacterial sinusitis. Side effects/risks discussed.   - rtc precautions.   Meds ordered this encounter  Medications  . doxycycline (VIBRA-TABS) 100 MG tablet    Sig: Take 1 tablet (100 mg total) by mouth 2 (two) times daily.    Dispense:  20 tablet    Refill:  0   Patient Instructions    Current signs and symptoms are likely due to allergies, less likely bacterial at this point.  Try Flonase nasal spray using technique we discussed, as well as saline nasal spray or Neti pot to help with congestion.  Afrin nasal spray may also help in the short-term for the pressure and congestion, but do not use that more than 3 days in a row.  It may be worth taking that with you on the trip in case you do have some congestion while flying.  If sinus symptoms including discolored nasal discharge or not improving into next week, start the printed prescription for doxycycline.  Return to the clinic or go to the nearest emergency room if any of your symptoms worsen or new symptoms occur.  Thanks for coming in today.    Sinus Headache A sinus headache occurs when the paranasal sinuses become clogged or  swollen. Paranasal sinuses are air pockets within the bones of the face. Sinus headaches can range from mild to severe. What are the causes? A sinus headache can result from various conditions that affect the sinuses, such as:  Colds.  Sinus infections.  Allergies.  What are the signs or symptoms? The main symptom of this condition is a headache that may feel like pain or pressure in the face, forehead, ears, or upper teeth. People who have a sinus headache often have other symptoms, such as:  Congested or runny nose.  Fever.  Inability to smell.  Weather changes can make symptoms worse. How is this diagnosed? This condition may be diagnosed based on:  A physical exam and medical history.  Imaging tests, such as a CT scan and MRI, to check for problems with the sinuses.  A specialist may look into the sinuses with a tool that has a camera (endoscopy).  How is this treated? Treatment for this condition depends on the cause.  Sinus pain that is caused by a sinus infection may be treated with antibiotic medicine.  Sinus pain that is caused by allergies may be helped by allergy medicines (antihistamines) and medicated nasal sprays.  Sinus pain that is caused by congestion may be helped by flushing the nose and sinuses with saline solution.  Follow these instructions at home:  Take medicines only as directed by your health care provider.  If you were prescribed an antibiotic medicine, finish all of it even if you start to feel better.  If you have congestion, use a nasal spray to help reduce pressure.  If directed, apply a warm, moist washcloth to your face to help relieve pain. Contact a health care provider if:  You have headaches more than one time each week.  You have sensitivity to light or sound.  You have a fever.  You feel sick to your stomach (nauseous) or you throw up (vomit).  Your headaches do not get better with treatment. Many people  think that they  have a sinus headache when they actually have migraines or tension headaches. Get help right away if:  You have vision problems.  You have sudden, severe pain in your face or head.  You have a seizure.  You are confused.  You have a stiff neck. This information is not intended to replace advice given to you by your health care provider. Make sure you discuss any questions you have with your health care provider. Document Released: 09/15/2004 Document Revised: 04/03/2016 Document Reviewed: 08/04/2014 Elsevier Interactive Patient Education  2018 [employer].   Sinusitis, Adult Sinusitis is soreness and inflammation of your sinuses. Sinuses are hollow spaces in the bones around your face. Your sinuses are located:  Around your eyes.  In the middle of your forehead.  Behind your nose.  In your cheekbones.  Your sinuses and nasal passages are lined with a stringy fluid (mucus). Mucus normally drains out of your sinuses. When your nasal tissues become inflamed or swollen, the mucus can become trapped or blocked so air cannot flow through your sinuses. This allows bacteria, viruses, and funguses to grow, which leads to infection. Sinusitis can develop quickly and last for 7?10 days (acute) or for more than 12 weeks (chronic). Sinusitis often develops after a cold. What are the causes? This condition is caused by anything that creates swelling in the sinuses or stops mucus from draining, including:  Allergies.  Asthma.  Bacterial or viral infection.  Abnormally shaped bones between the nasal passages.  Nasal growths that contain mucus (nasal polyps).  Narrow sinus openings.  Pollutants, such as chemicals or irritants in the air.  A foreign object stuck in the nose.  A fungal infection. This is rare.  What increases the risk? The following factors may make you more likely to develop this condition:  Having allergies or asthma.  Having had a recent cold or respiratory  tract infection.  Having structural deformities or blockages in your nose or sinuses.  Having a weak immune system.  Doing a lot of swimming or diving.  Overusing nasal sprays.  Smoking.  What are the signs or symptoms? The main symptoms of this condition are pain and a feeling of pressure around the affected sinuses. Other symptoms include:  Upper toothache.  Earache.  Headache.  Bad breath.  Decreased sense of smell and taste.  A cough that may get worse at night.  Fatigue.  Fever.  Thick drainage from your nose. The drainage is often green and it may contain pus (purulent).  Stuffy nose or congestion.  Postnasal drip. This is when extra mucus collects in the throat or back of the nose.  Swelling and warmth over the affected sinuses.  Sore throat.  Sensitivity to light.  How is this diagnosed? This condition is diagnosed based on symptoms, a medical history, and a physical exam. To find out if your condition is acute or chronic, your health care provider may:  Look in your nose for signs of nasal polyps.  Tap over the affected sinus to check for signs of infection.  View the inside of your sinuses using an imaging device that has a light attached (endoscope).  If your health care provider suspects that you have chronic sinusitis, you may also:  Be tested for allergies.  Have a sample of mucus taken from your nose (nasal culture) and checked for bacteria.  Have a mucus sample examined to see if your sinusitis is related to an allergy.  If your sinusitis  does not respond to treatment and it lasts longer than 8 weeks, you may have an MRI or CT scan to check your sinuses. These scans also help to determine how severe your infection is. In rare cases, a bone biopsy may be done to rule out more serious types of fungal sinus disease. How is this treated? Treatment for sinusitis depends on the cause and whether your condition is chronic or acute. If a virus is  causing your sinusitis, your symptoms will go away on their own within 10 days. You may be given medicines to relieve your symptoms, including:  Topical nasal decongestants. They shrink swollen nasal passages and let mucus drain from your sinuses.  Antihistamines. These drugs block inflammation that is triggered by allergies. This can help to ease swelling in your nose and sinuses.  Topical nasal corticosteroids. These are nasal sprays that ease inflammation and swelling in your nose and sinuses.  Nasal saline washes. These rinses can help to get rid of thick mucus in your nose.  If your condition is caused by bacteria, you will be given an antibiotic medicine. If your condition is caused by a fungus, you will be given an antifungal medicine. Surgery may be needed to correct underlying conditions, such as narrow nasal passages. Surgery may also be needed to remove polyps. Follow these instructions at home: Medicines  Take, use, or apply over-the-counter and prescription medicines only as told by your health care provider. These may include nasal sprays.  If you were prescribed an antibiotic medicine, take it as told by your health care provider. Do not stop taking the antibiotic even if you start to feel better. Hydrate and Humidify  Drink enough water to keep your urine clear or pale yellow. Staying hydrated will help to thin your mucus.  Use a cool mist humidifier to keep the humidity level in your home above 50%.  Inhale steam for 10-15 minutes, 3-4 times a day or as told by your health care provider. You can do this in the bathroom while a hot shower is running.  Limit your exposure to cool or dry air. Rest  Rest as much as possible.  Sleep with your head raised (elevated).  Make sure to get enough sleep each night. General instructions  Apply a warm, moist washcloth to your face 3-4 times a day or as told by your health care provider. This will help with discomfort.  Wash  your hands often with soap and water to reduce your exposure to viruses and other germs. If soap and water are not available, use hand sanitizer.  Do not smoke. Avoid being around people who are smoking (secondhand smoke).  Keep all follow-up visits as told by your health care provider. This is important. Contact a health care provider if:  You have a fever.  Your symptoms get worse.  Your symptoms do not improve within 10 days. Get help right away if:  You have a severe headache.  You have persistent vomiting.  You have pain or swelling around your face or eyes.  You have vision problems.  You develop confusion.  Your neck is stiff.  You have trouble breathing. This information is not intended to replace advice given to you by your health care provider. Make sure you discuss any questions you have with your health care provider. Document Released: 08/08/2005 Document Revised: 04/03/2016 Document Reviewed: 06/03/2015 Elsevier Interactive Patient Education  [employer].   IF you received an x-ray today, you will receive an  invoice from [facility]. Please contact [facility] at [phone] with questions or concerns regarding your invoice.   IF you received labwork today, you will receive an invoice from [facility]. Please contact [facility] at [phone] with questions or concerns regarding your invoice.   Our billing staff will not be able to assist you with questions regarding bills from these companies.  You will be contacted with the lab results as soon as they are available. The fastest way to get your results is to activate your My Chart account. Instructions are located on the last page of this paperwork. If you have not heard from [place] regarding the results in 2 weeks, please contact this office.       I personally performed the services described in this documentation, which was scribed in my presence. The recorded information has been  reviewed and considered for accuracy and completeness, addended by me as needed, and agree with information above.  Signed,   Merri Ray, MD [facility] at [place].  04/15/18 2:04 PM

## 2018-04-05 NOTE — Patient Instructions (Addendum)
Current signs and symptoms are likely due to allergies, less likely bacterial at this point.  Try Flonase nasal spray using technique we discussed, as well as saline nasal spray or Neti pot to help with congestion.  Afrin nasal spray may also help in the short-term for the pressure and congestion, but do not use that more than 3 days in a row.  It may be worth taking that with you on the trip in case you do have some congestion while flying.  If sinus symptoms including discolored nasal discharge or not improving into next week, start the printed prescription for doxycycline.  Return to the clinic or go to the nearest emergency room if any of your symptoms worsen or new symptoms occur.  Thanks for coming in today.    Sinus Headache A sinus headache occurs when the paranasal sinuses become clogged or swollen. Paranasal sinuses are air pockets within the bones of the face. Sinus headaches can range from mild to severe. What are the causes? A sinus headache can result from various conditions that affect the sinuses, such as:  Colds.  Sinus infections.  Allergies.  What are the signs or symptoms? The main symptom of this condition is a headache that may feel like pain or pressure in the face, forehead, ears, or upper teeth. People who have a sinus headache often have other symptoms, such as:  Congested or runny nose.  Fever.  Inability to smell.  Weather changes can make symptoms worse. How is this diagnosed? This condition may be diagnosed based on:  A physical exam and medical history.  Imaging tests, such as a CT scan and MRI, to check for problems with the sinuses.  A specialist may look into the sinuses with a tool that has a camera (endoscopy).  How is this treated? Treatment for this condition depends on the cause.  Sinus pain that is caused by a sinus infection may be treated with antibiotic medicine.  Sinus pain that is caused by allergies may be helped by allergy  medicines (antihistamines) and medicated nasal sprays.  Sinus pain that is caused by congestion may be helped by flushing the nose and sinuses with saline solution.  Follow these instructions at home:  Take medicines only as directed by your health care provider.  If you were prescribed an antibiotic medicine, finish all of it even if you start to feel better.  If you have congestion, use a nasal spray to help reduce pressure.  If directed, apply a warm, moist washcloth to your face to help relieve pain. Contact a health care provider if:  You have headaches more than one time each week.  You have sensitivity to light or sound.  You have a fever.  You feel sick to your stomach (nauseous) or you throw up (vomit).  Your headaches do not get better with treatment. Many people think that they have a sinus headache when they actually have migraines or tension headaches. Get help right away if:  You have vision problems.  You have sudden, severe pain in your face or head.  You have a seizure.  You are confused.  You have a stiff neck. This information is not intended to replace advice given to you by your health care provider. Make sure you discuss any questions you have with your health care provider. Document Released: 09/15/2004 Document Revised: 04/03/2016 Document Reviewed: 08/04/2014 Elsevier Interactive Patient Education  2018 [employer].   Sinusitis, Adult Sinusitis is soreness and inflammation of your  sinuses. Sinuses are hollow spaces in the bones around your face. Your sinuses are located:  Around your eyes.  In the middle of your forehead.  Behind your nose.  In your cheekbones.  Your sinuses and nasal passages are lined with a stringy fluid (mucus). Mucus normally drains out of your sinuses. When your nasal tissues become inflamed or swollen, the mucus can become trapped or blocked so air cannot flow through your sinuses. This allows bacteria, viruses, and  funguses to grow, which leads to infection. Sinusitis can develop quickly and last for 7?10 days (acute) or for more than 12 weeks (chronic). Sinusitis often develops after a cold. What are the causes? This condition is caused by anything that creates swelling in the sinuses or stops mucus from draining, including:  Allergies.  Asthma.  Bacterial or viral infection.  Abnormally shaped bones between the nasal passages.  Nasal growths that contain mucus (nasal polyps).  Narrow sinus openings.  Pollutants, such as chemicals or irritants in the air.  A foreign object stuck in the nose.  A fungal infection. This is rare.  What increases the risk? The following factors may make you more likely to develop this condition:  Having allergies or asthma.  Having had a recent cold or respiratory tract infection.  Having structural deformities or blockages in your nose or sinuses.  Having a weak immune system.  Doing a lot of swimming or diving.  Overusing nasal sprays.  Smoking.  What are the signs or symptoms? The main symptoms of this condition are pain and a feeling of pressure around the affected sinuses. Other symptoms include:  Upper toothache.  Earache.  Headache.  Bad breath.  Decreased sense of smell and taste.  A cough that may get worse at night.  Fatigue.  Fever.  Thick drainage from your nose. The drainage is often green and it may contain pus (purulent).  Stuffy nose or congestion.  Postnasal drip. This is when extra mucus collects in the throat or back of the nose.  Swelling and warmth over the affected sinuses.  Sore throat.  Sensitivity to light.  How is this diagnosed? This condition is diagnosed based on symptoms, a medical history, and a physical exam. To find out if your condition is acute or chronic, your health care provider may:  Look in your nose for signs of nasal polyps.  Tap over the affected sinus to check for signs of  infection.  View the inside of your sinuses using an imaging device that has a light attached (endoscope).  If your health care provider suspects that you have chronic sinusitis, you may also:  Be tested for allergies.  Have a sample of mucus taken from your nose (nasal culture) and checked for bacteria.  Have a mucus sample examined to see if your sinusitis is related to an allergy.  If your sinusitis does not respond to treatment and it lasts longer than 8 weeks, you may have an MRI or CT scan to check your sinuses. These scans also help to determine how severe your infection is. In rare cases, a bone biopsy may be done to rule out more serious types of fungal sinus disease. How is this treated? Treatment for sinusitis depends on the cause and whether your condition is chronic or acute. If a virus is causing your sinusitis, your symptoms will go away on their own within 10 days. You may be given medicines to relieve your symptoms, including:  Topical nasal decongestants. They shrink swollen  nasal passages and let mucus drain from your sinuses.  Antihistamines. These drugs block inflammation that is triggered by allergies. This can help to ease swelling in your nose and sinuses.  Topical nasal corticosteroids. These are nasal sprays that ease inflammation and swelling in your nose and sinuses.  Nasal saline washes. These rinses can help to get rid of thick mucus in your nose.  If your condition is caused by bacteria, you will be given an antibiotic medicine. If your condition is caused by a fungus, you will be given an antifungal medicine. Surgery may be needed to correct underlying conditions, such as narrow nasal passages. Surgery may also be needed to remove polyps. Follow these instructions at home: Medicines  Take, use, or apply over-the-counter and prescription medicines only as told by your health care provider. These may include nasal sprays.  If you were prescribed an  antibiotic medicine, take it as told by your health care provider. Do not stop taking the antibiotic even if you start to feel better. Hydrate and Humidify  Drink enough water to keep your urine clear or pale yellow. Staying hydrated will help to thin your mucus.  Use a cool mist humidifier to keep the humidity level in your home above 50%.  Inhale steam for 10-15 minutes, 3-4 times a day or as told by your health care provider. You can do this in the bathroom while a hot shower is running.  Limit your exposure to cool or dry air. Rest  Rest as much as possible.  Sleep with your head raised (elevated).  Make sure to get enough sleep each night. General instructions  Apply a warm, moist washcloth to your face 3-4 times a day or as told by your health care provider. This will help with discomfort.  Wash your hands often with soap and water to reduce your exposure to viruses and other germs. If soap and water are not available, use hand sanitizer.  Do not smoke. Avoid being around people who are smoking (secondhand smoke).  Keep all follow-up visits as told by your health care provider. This is important. Contact a health care provider if:  You have a fever.  Your symptoms get worse.  Your symptoms do not improve within 10 days. Get help right away if:  You have a severe headache.  You have persistent vomiting.  You have pain or swelling around your face or eyes.  You have vision problems.  You develop confusion.  Your neck is stiff.  You have trouble breathing. This information is not intended to replace advice given to you by your health care provider. Make sure you discuss any questions you have with your health care provider. Document Released: 08/08/2005 Document Revised: 04/03/2016 Document Reviewed: 06/03/2015 Elsevier Interactive Patient Education  2018 [employer].   IF you received an x-ray today, you will receive an invoice from [facility].  Please contact [facility] at [phone] with questions or concerns regarding your invoice.   IF you received labwork today, you will receive an invoice from [place]. Please contact [facility] at [phone] with questions or concerns regarding your invoice.   Our billing staff will not be able to assist you with questions regarding bills from these companies.  You will be contacted with the lab results as soon as they are available. The fastest way to get your results is to activate your My Chart account. Instructions are located on the last page of this paperwork. If you have not heard from [place]  regarding the results in 2 weeks, please contact this office.

## 2018-04-10 ENCOUNTER — Other Ambulatory Visit: Payer: Self-pay | Admitting: Internal Medicine

## 2018-04-10 NOTE — Telephone Encounter (Signed)
Received electronic refill request for Clonazepam 0.5 mg taking 1 or 2 tablets daily as needed for sleep. Last refilled 10/31/17, #30 with 5 refills. Patient last office visit was 05/29/17, and recommended to follow up with you in one year. As of this message the patient does not have a follow up appointment scheduled. Is this request ok to be filled?

## 2018-04-11 NOTE — Telephone Encounter (Signed)
OK top refill total 6 months

## 2018-04-12 ENCOUNTER — Telehealth: Payer: Self-pay | Admitting: Internal Medicine

## 2018-04-12 MED FILL — clonazePAM 0.5 MG TABS: 0.5 | 15 days supply | Qty: 30 | Fill #0

## 2018-04-12 NOTE — Telephone Encounter (Signed)
Called and spoke to patient. Scheduled her for a year ROV with [facility].  [place] to give Rx information from Dr. Lamonte Sakai. Pharmacist reported that Joellen Jersey had already called in over a half an hour prior and gave order with 5 refills.  Nothing further needed at this time.

## 2018-04-12 NOTE — Telephone Encounter (Signed)
OK to refill until her planned OV with Dr Annamaria Boots.

## 2018-04-12 NOTE — Telephone Encounter (Signed)
Called and spoke to pt.  Pt is requesting refill on Klonopin 0.5tab. 1-2 tab daily prn for sleep.  Last refilled 10/31/17 #30 with 5 refills.  Last OV 05/29/17 with pending recall for 05/29/18. Pt states she will be going out of the country on 04/13/18 and request refill prior.  Preferred pharmacy is [facility].  RB please advise, as CY is unavailable. Thanks

## 2018-05-01 ENCOUNTER — Other Ambulatory Visit: Payer: Self-pay

## 2018-05-01 ENCOUNTER — Ambulatory Visit (INDEPENDENT_AMBULATORY_CARE_PROVIDER_SITE_OTHER): Payer: 59 | Admitting: Family Medicine

## 2018-05-01 ENCOUNTER — Encounter: Payer: Self-pay | Admitting: Family Medicine

## 2018-05-01 VITALS — BP 119/78 | HR 71 | Temp 98.5°F | Ht 63.5 in | Wt 148.0 lb

## 2018-05-01 DIAGNOSIS — Z1329 Encounter for screening for other suspected endocrine disorder: Secondary | ICD-10-CM | POA: Diagnosis not present

## 2018-05-01 DIAGNOSIS — L659 Nonscarring hair loss, unspecified: Secondary | ICD-10-CM

## 2018-05-01 DIAGNOSIS — Z130 Encounter for screening for diseases of the blood and blood-forming organs and certain disorders involving the immune mechanism: Secondary | ICD-10-CM | POA: Diagnosis not present

## 2018-05-01 NOTE — Patient Instructions (Addendum)
   I will refer you to dermatology, but will talk to them hopefully on the phone today to see if any recommendations in the meantime.  Either myself or our staff will give you a call if there are any changes.  I did check for iron deficiency as well as thyroid screening today.   If you have lab work done today you will be contacted with your lab results within the next 2 weeks.  If you have not heard from [place] then please contact us. The fastest way to get your results is to register for My Chart.   IF you received an x-ray today, you will receive an invoice from [facility]. Please contact [facility]iology at [phone] with questions or concerns regarding your invoice.   IF you received labwork today, you will receive an invoice from [place]. Please contact [facility] at [phone] with questions or concerns regarding your invoice.   Our billing staff will not be able to assist you with questions regarding bills from these companies.  You will be contacted with the lab results as soon as they are available. The fastest way to get your results is to activate your My Chart account. Instructions are located on the last page of this paperwork. If you have not heard from [place] regarding the results in 2 weeks, please contact this office.

## 2018-05-01 NOTE — Progress Notes (Signed)
Subjective:  By signing my name below, I, Jodi Nunez, attest that this documentation has been prepared under the direction and in the presence of Jodi Agreste, MD Electronically Signed: Ladene Artist, ED Scribe 05/01/2018 at 12:19 PM.   Patient ID: Jodi Nunez., female    DOB: Aug 15, 1961, 57 y.o.   MRN: [id number]  Chief Complaint  Patient presents with  . skin Irritation    on scap and scalp tinging, lost half of hair in shedding in a week   HPI Jodi Nunez is a 57 y.o. female who presents to Primary Care at [facility] complaining of scalp irritation x 1 wk. Pt noticed a painful, tingling sensation and itching in her scalp 1 wk ago. She noticed symptoms prior to hair loss. States she has lost half of her hair volume within 1 wk, mostly while showering. She has noticed phases of more shedding every 3-4 yrs since chemo ~18 yrs ago when she had complete hair loss. No contacts with similar symptoms at home. She has mentioned this to prev dermatologist Dr. Elenora Nunez in the past, treated with minoxidil. Denies fever, rash, hair lost after stopping minoxidil ~1 month ago, new stressors, new hair treatments, new  shampoos or conditioners. She does report a h/o borderline low thyroid.  Patient Active Problem List   Diagnosis Date Noted  . Migraines 06/26/2017  . Cellulitis of left arm 06/25/2017  . Insomnia 01/29/2017   Past Medical History:  Diagnosis Date  . Allergy   . Breast cancer ([place]) 2006   ER-/PR-/her2+  . Menopausal and perimenopausal disorder   . Migraines    Past Surgical History:  Procedure Laterality Date  . BREAST SURGERY    . CESAREAN SECTION    . lymph node resection    . MASTECTOMY     Allergies  Allergen Reactions  . Penicillins Anaphylaxis    Has patient had a PCN reaction causing immediate rash, facial/tongue/throat swelling, SOB or lightheadedness with hypotension: Yes Has patient had a PCN reaction causing severe rash involving mucus membranes or  skin necrosis: No Has patient had a PCN reaction that required hospitalization No Has patient had a PCN reaction occurring within the last 10 years: Yes If all of the above answers are "NO", then may proceed with Cephalosporin use.    Prior to Admission medications   Medication Sig Start Date End Date Taking? Authorizing Provider  amphetamine-dextroamphetamine (ADDERALL) 20 MG tablet Take 20 mg by mouth daily.    [provider]  BIOTIN PO Take 1 tablet by mouth daily.    [provider]  CALCIUM PO Take 1 tablet by mouth daily.    [provider]  Cholecalciferol (VITAMIN D PO) Take 1 tablet by mouth daily.     [provider]  clonazePAM (KLONOPIN) 0.5 MG tablet TAKE 1 TO 2 TABLETS BY MOUTH DAILY AS NEEDED FOR SLEEP 04/12/18   Young, Kasandra Knudsen, MD  doxycycline (VIBRA-TABS) 100 MG tablet Take 1 tablet (100 mg total) by mouth 2 (two) times daily. 04/05/18   Jodi Agreste, MD  ibuprofen (ADVIL,MOTRIN) 200 MG tablet Take 600 mg by mouth every 6 (six) hours as needed for moderate pain.    [provider]  Multiple Vitamin (MULITIVITAMIN WITH MINERALS) TABS Take 1 tablet by mouth daily.    [provider]  rizatriptan (MAXALT) 10 MG tablet Take 10 mg by mouth daily as needed for migraine. 03/07/16   [provider]  zolpidem (AMBIEN) 10 MG  tablet Take 10 mg by mouth at bedtime. 03/07/16   [provider]   Social History   Socioeconomic History  . Marital status: Married    Spouse name: Not on file  . Number of children: Not on file  . Years of education: Not on file  . Highest education level: Not on file  Occupational History  . Not on file  Social Needs  . Financial resource strain: Not on file  . Food insecurity:    Worry: Not on file    Inability: Not on file  . Transportation needs:    Medical: Not on file    Non-medical: Not on file  Tobacco Use  . Smoking status: Former Smoker    Packs/day: 0.20     Years: 10.00    Pack years: 2.00    Types: Cigarettes    Last attempt to quit: 01/25/1997    Years since quitting: 21.2  . Smokeless tobacco: Never Used  Substance and Sexual Activity  . Alcohol use: Yes    Comment: occasional  . Drug use: No  . Sexual activity: Not on file  Lifestyle  . Physical activity:    Days per week: Not on file    Minutes per session: Not on file  . Stress: Not on file  Relationships  . Social connections:    Talks on phone: Not on file    Gets together: Not on file    Attends religious service: Not on file    Active member of club or organization: Not on file    Attends meetings of clubs or organizations: Not on file    Relationship status: Not on file  . Intimate partner violence:    Fear of current or ex partner: Not on file    Emotionally abused: Not on file    Physically abused: Not on file    Forced sexual activity: Not on file  Other Topics Concern  . Not on file  Social History Narrative  . Not on file   Review of Systems  Constitutional: Negative for fever.  Skin: Negative for rash.       + scalp irritation      Objective:   Physical Exam  Constitutional: She is oriented to person, place, and time. She appears well-developed and well-nourished. No distress.  HENT:  Head: Normocephalic and atraumatic.  Scalp: Some diffuse patches of thinning without any discrete absent patches. No shortened or broken hairs in thinned areas. No scalp erythema or crusting. Few excoriated areas on upper scalp.  Eyes: Conjunctivae and EOM are normal.  Neck: Neck supple. No tracheal deviation present.  Cardiovascular: Normal rate.  Pulmonary/Chest: Effort normal. No respiratory distress.  Musculoskeletal: Normal range of motion.  Neurological: She is alert and oriented to person, place, and time.  Skin: Skin is warm and dry.  Psychiatric: She has a normal mood and affect. Her behavior is normal.  Nursing note and vitals reviewed.  Vitals:   05/01/18  1157  BP: 119/78  Pulse: 71  Temp: 98.5 F (36.9 C)  TempSrc: Oral  SpO2: 97%  Weight: 148 lb (67.1 kg)  Height: 5' 3.5" (1.613 m)      Assessment & Plan:  Jodi Nunez. is a 57 y.o. female Hair loss - Plan: TSH + free T4, Iron, TIBC and Ferritin Panel, Ambulatory referral to Dermatology  Screening for thyroid disorder - Plan: TSH + free T4  Screening, iron deficiency anemia - Plan: Iron, TIBC and Ferritin Panel  Diffuse, acute hair loss.  Denies recent new chemical exposures or chemical treatments, did use hair dye previous prior but similar treatment as used in the past.  No apparent signs of seborrhea, no apparent signs of tinea, but few small abraded areas of dorsal scalp possibly from pruritus.  -Due to acuity of symptoms obtained referral to dermatology following day.  Screen for thyroid disease and iron deficiency anemia as above.  No orders of the defined types were placed in this encounter.  Patient Instructions     I will refer you to dermatology, but will talk to them hopefully on the phone today to see if any recommendations in the meantime.  Either myself or our staff will give you a call if there are any changes.  I did check for iron deficiency as well as thyroid screening today.   If you have lab work done today you will be contacted with your lab results within the next 2 weeks.  If you have not heard from [place] then please contact us. The fastest way to get your results is to register for My Chart.   IF you received an x-ray today, you will receive an invoice from [facility]. Please contact [facility] Radiology at [phone] with questions or concerns regarding your invoice.   IF you received labwork today, you will receive an invoice from [place]. Please contact [facility] at [phone] with questions or concerns regarding your invoice.   Our billing staff will not be able to assist you with questions regarding bills from these companies.  You  will be contacted with the lab results as soon as they are available. The fastest way to get your results is to activate your My Chart account. Instructions are located on the last page of this paperwork. If you have not heard from [place] regarding the results in 2 weeks, please contact this office.       I personally performed the services described in this documentation, which was scribed in my presence. The recorded information has been reviewed and considered for accuracy and completeness, addended by me as needed, and agree with information above.  Signed,   Merri Ray, MD Primary [facility] at [place].  05/01/18 9:25 PM

## 2018-05-02 DIAGNOSIS — L650 Telogen effluvium: Secondary | ICD-10-CM | POA: Diagnosis not present

## 2018-05-02 LAB — IRON,TIBC AND FERRITIN PANEL
FERRITIN: 65 ng/mL (ref 15–150)
IRON SATURATION: 44 % (ref 15–55)
IRON: 133 ug/dL (ref 27–159)
Total Iron Binding Capacity: 302 ug/dL (ref 250–450)
UIBC: 169 ug/dL (ref 131–425)

## 2018-05-02 LAB — TSH+FREE T4
Free T4: 1.07 ng/dL (ref 0.82–1.77)
TSH: 1.92 u[IU]/mL (ref 0.450–4.500)

## 2018-05-02 MED FILL — BETAMETHASONE DIPROPIONATE: 0.05 | 30 days supply | Qty: 60 | Fill #0

## 2018-05-02 MED FILL — METHYLPREDNISOLONE 4 MG TAB: 4 | 6 days supply | Qty: 21 | Fill #0

## 2018-05-10 MED FILL — clonazePAM 0.5 MG TABS: 0.5 | 15 days supply | Qty: 30 | Fill #1

## 2018-06-01 DIAGNOSIS — L650 Telogen effluvium: Secondary | ICD-10-CM | POA: Diagnosis not present

## 2018-06-04 MED FILL — clonazePAM 0.5 MG TABS: 0.5 | 15 days supply | Qty: 30 | Fill #2

## 2018-06-05 DIAGNOSIS — N951 Menopausal and female climacteric states: Secondary | ICD-10-CM | POA: Diagnosis not present

## 2018-06-05 DIAGNOSIS — G479 Sleep disorder, unspecified: Secondary | ICD-10-CM | POA: Diagnosis not present

## 2018-06-05 DIAGNOSIS — R5383 Other fatigue: Secondary | ICD-10-CM | POA: Diagnosis not present

## 2018-06-05 DIAGNOSIS — N898 Other specified noninflammatory disorders of vagina: Secondary | ICD-10-CM | POA: Diagnosis not present

## 2018-06-05 DIAGNOSIS — R4586 Emotional lability: Secondary | ICD-10-CM | POA: Diagnosis not present

## 2018-06-12 DIAGNOSIS — N951 Menopausal and female climacteric states: Secondary | ICD-10-CM | POA: Diagnosis not present

## 2018-06-12 DIAGNOSIS — G479 Sleep disorder, unspecified: Secondary | ICD-10-CM | POA: Diagnosis not present

## 2018-06-12 DIAGNOSIS — M26629 Arthralgia of temporomandibular joint, unspecified side: Secondary | ICD-10-CM | POA: Diagnosis not present

## 2018-06-12 DIAGNOSIS — R6882 Decreased libido: Secondary | ICD-10-CM | POA: Diagnosis not present

## 2018-06-22 MED FILL — ZOLPIDEM TARTRATE 10 MG TAB: 10 | 90 days supply | Qty: 90 | Fill #0

## 2018-06-25 MED FILL — RIZATRIPTAN BENZOATE 10 MG: 10 | 30 days supply | Qty: 9 | Fill #0

## 2018-07-02 MED FILL — clonazePAM 0.5 MG TABS: 0.5 | 15 days supply | Qty: 30 | Fill #3

## 2018-07-13 ENCOUNTER — Encounter: Payer: Self-pay | Admitting: Internal Medicine

## 2018-07-13 ENCOUNTER — Ambulatory Visit (INDEPENDENT_AMBULATORY_CARE_PROVIDER_SITE_OTHER): Payer: 59 | Admitting: Internal Medicine

## 2018-07-13 DIAGNOSIS — F5101 Primary insomnia: Secondary | ICD-10-CM | POA: Diagnosis not present

## 2018-07-13 NOTE — Progress Notes (Signed)
HPI female former smoker followed for chronic insomnia, shift work sleep disorder complicated by history breast cancer  ------------------------------------------------------------------------------------------  05/29/17- 57 year old female former smoker followed for chronic insomnia, shift work sleep disorder complicated by history breast cancer Taking Adderall 20 mg daily early morning, clonazepam 0.5 mg, melatonin, Maxalt 10 mg as needed Follows for: Insomnia.  pt states she is improved since changing med regimen at last OV.  I had supported her working either fixed dayshift or second shift, but not night shift or irregular schedule. She is concerned now that her job may require third shift call in. She is controlling the insomnia by taking clonazepam 0.5-0.75 mg, with Ambien 10 mg. This works well with no carryover, but she doesn't want to drive in at night after she has taken these meds at bedtime. She still takes Adderall only every week or 2, sodium lingering stimulation from that is not part of the insomnia problem.  07/13/2018- 57 year old female former smoker followed for chronic insomnia, shift work sleep disorder complicated by history breast cancer -----Follows for: Insomnia, Klonopin has helped her sleep through the night,  still using Ambien as well, Adderall is prescribed but she doesnt take this everyday Clonazepam 0.5-0.75 mg plus Ambien 10 mg, Adderall 20 mg, melatonin, Maxalt Continues to work as a Therapist, music with some shift changes.  Usually uses clonazepam 0.5 mg tablet with Ambien on weekends and 0.75 mg with Ambien on workdays.  Says with this she sleeps comfortably, wakes easily and feels well in the morning with no carryover.  Denies problems with memory or daytime alertness.  The Ambien is prescribed by her GYN. Goes to gym regularly.  ROS-see HPI   "+" = positive Constitutional:    weight loss, night sweats, fevers, chills, fatigue, lassitude. HEENT:    headaches,  difficulty swallowing, tooth/dental problems, sore throat,       sneezing, itching, ear ache, nasal congestion, post nasal drip, snoring CV:    chest pain, orthopnea, PND, swelling in lower extremities, anasarca,                                                             dizziness, palpitations Resp:   shortness of breath with exertion or at rest.                productive cough,   non-productive cough, coughing up of blood.              change in color of mucus.  wheezing.   Skin:    rash or lesions. GI:  No-   heartburn, indigestion, abdominal pain, nausea, vomiting, diarrhea,                 change in bowel habits, loss of appetite GU: dysuria, change in color of urine, no urgency or frequency.   flank pain. MS:   joint pain, stiffness, decreased range of motion, back pain. Neuro-     nothing unusual Psych:  change in mood or affect.  depression or anxiety.   memory loss.  OBJ- Physical Exam General- Alert, Oriented, Affect-appropriate, Distress- none acute Skin- rash-none, lesions- none, excoriation- none Lymphadenopathy- none Head- atraumatic            Eyes- Gross vision intact, PERRLA, conjunctivae and secretions clear  Ears- Hearing, canals-normal            Nose- Clear, no-Septal dev, mucus, polyps, erosion, perforation             Throat- Mallampati II , mucosa clear , drainage- none, tonsils- atrophic Neck- flexible , trachea midline, no stridor , thyroid nl, carotid no bruit Chest - symmetrical excursion , unlabored           Heart/CV- RRR , no murmur , no gallop  , no rub, nl s1 s2                           - JVD- none , edema- none, stasis changes- none, varices- none           Lung- clear to P&A, wheeze- none, cough- none , dullness-none, rub- none           Chest wall- reported bilateral mastectomy with implant reconstruction Abd-  Br/ Gen/ Rectal- Not done, not indicated Extrem- cyanosis- none, clubbing, none, atrophy- none, strength- nl Neuro- grossly  intact to observation

## 2018-07-13 NOTE — Patient Instructions (Signed)
Ok to continue current meds  Please call as needed

## 2018-07-13 NOTE — Assessment & Plan Note (Signed)
Chronic primary insomnia aggravated by shift work changes and job stress.  We discussed and continue to watch closely the medication pattern.  There is been no abuse and no apparent side effects. Plan-okay to continue current regimen.

## 2018-07-16 DIAGNOSIS — Z01419 Encounter for gynecological examination (general) (routine) without abnormal findings: Secondary | ICD-10-CM | POA: Diagnosis not present

## 2018-07-16 DIAGNOSIS — Z6826 Body mass index (BMI) 26.0-26.9, adult: Secondary | ICD-10-CM | POA: Diagnosis not present

## 2018-07-16 MED FILL — AMPHETAMINE-DEXTROAMPHETAMI: 10 | 30 days supply | Qty: 30 | Fill #0

## 2018-07-23 MED FILL — clonazePAM 0.5 MG TABS: 0.5 | 15 days supply | Qty: 30 | Fill #4

## 2018-08-09 MED FILL — RIZATRIPTAN BENZOATE 10 MG: 10 | 30 days supply | Qty: 9 | Fill #1

## 2018-08-09 MED FILL — clonazePAM 0.5 MG TABS: 0.5 | 15 days supply | Qty: 30 | Fill #5

## 2018-09-03 ENCOUNTER — Other Ambulatory Visit: Payer: Self-pay | Admitting: Internal Medicine

## 2018-09-04 NOTE — Telephone Encounter (Signed)
CY Please advise on refill and send if approved.

## 2018-09-05 MED FILL — AMPHETAMINE-DEXTROAMPHETAMI: 10 | 30 days supply | Qty: 30 | Fill #0

## 2018-09-05 MED FILL — clonazePAM 0.5 MG TABS: 0.5 | 15 days supply | Qty: 30 | Fill #0

## 2018-09-05 MED FILL — RIZATRIPTAN BENZOATE 10 MG: 10 | 90 days supply | Qty: 27 | Fill #2

## 2018-09-05 NOTE — Telephone Encounter (Signed)
Clonazepam refill e-sent 

## 2018-09-18 MED FILL — clonazePAM 0.5 MG TABS: 0.5 | 15 days supply | Qty: 30 | Fill #1

## 2018-09-20 MED FILL — ZOLPIDEM TARTRATE 10 MG TAB: 10 | 90 days supply | Qty: 90 | Fill #0

## 2018-10-03 MED FILL — clonazePAM 0.5 MG TABS: 0.5 | 15 days supply | Qty: 30 | Fill #2

## 2018-10-18 MED FILL — clonazePAM 0.5 MG TABS: 0.5 | 15 days supply | Qty: 30 | Fill #3

## 2018-10-26 DIAGNOSIS — R5383 Other fatigue: Secondary | ICD-10-CM | POA: Diagnosis not present

## 2018-10-26 DIAGNOSIS — N951 Menopausal and female climacteric states: Secondary | ICD-10-CM | POA: Diagnosis not present

## 2018-10-26 DIAGNOSIS — R6882 Decreased libido: Secondary | ICD-10-CM | POA: Diagnosis not present

## 2018-10-29 MED FILL — ARMOUR THYROID 15 MG TABLET: 15 | 30 days supply | Qty: 30 | Fill #0

## 2018-11-03 MED FILL — clonazePAM 0.5 MG TABS: 0.5 | 15 days supply | Qty: 30 | Fill #4

## 2018-11-19 MED FILL — AMPHETAMINE-DEXTROAMPHETAMI: 10 | 30 days supply | Qty: 30 | Fill #0

## 2018-11-19 MED FILL — clonazePAM 0.5 MG TABS: 0.5 | 15 days supply | Qty: 30 | Fill #5

## 2018-11-23 MED FILL — ARMOUR THYROID 15 MG TABLET: 15 | 30 days supply | Qty: 30 | Fill #1

## 2018-12-03 MED FILL — RIZATRIPTAN BENZOATE 10 MG: 10 | 30 days supply | Qty: 9 | Fill #3

## 2018-12-06 ENCOUNTER — Other Ambulatory Visit: Payer: Self-pay | Admitting: Internal Medicine

## 2018-12-06 MED FILL — clonazePAM 0.5 MG TABS: 0.5 | 15 days supply | Qty: 30 | Fill #0

## 2018-12-06 NOTE — Telephone Encounter (Signed)
Refill request received for clonazepam 0.5mg . pt last seen 07/13/18, med last filled 09/05/2018 #30 tabs with 5 RF. Dr. Annamaria Boots, please advise if you are okay refilling med for pt. Thanks!

## 2018-12-20 MED FILL — ZOLPIDEM TARTRATE 10 MG TAB: 10 | 90 days supply | Qty: 90 | Fill #0

## 2018-12-25 MED FILL — ARMOUR THYROID 15 MG TABLET: 15 | 30 days supply | Qty: 30 | Fill #2

## 2018-12-28 MED FILL — clonazePAM 0.5 MG TABS: 0.5 | 15 days supply | Qty: 30 | Fill #1

## 2019-01-01 MED FILL — RIZATRIPTAN BENZOATE 10 MG: 10 | 30 days supply | Qty: 18 | Fill #0

## 2019-01-14 MED FILL — clonazePAM 0.5 MG TABS: 0.5 | 15 days supply | Qty: 30 | Fill #2

## 2019-01-28 MED FILL — ARMOUR THYROID 15 MG TABLET: 15 | 30 days supply | Qty: 30 | Fill #3

## 2019-01-31 MED FILL — clonazePAM 0.5 MG TABS: 0.5 | 15 days supply | Qty: 30 | Fill #3

## 2019-02-18 MED FILL — clonazePAM 0.5 MG TABS: 0.5 | 15 days supply | Qty: 30 | Fill #4

## 2019-02-26 MED FILL — AMPHETAMINE-DEXTROAMPHETAMI: 10 | 30 days supply | Qty: 30 | Fill #0

## 2019-03-04 MED FILL — ARMOUR THYROID 15 MG TABLET: 15 | 30 days supply | Qty: 30 | Fill #0

## 2019-03-08 MED FILL — clonazePAM 0.5 MG TABS: 0.5 | 15 days supply | Qty: 30 | Fill #5

## 2019-03-18 MED FILL — ZOLPIDEM TARTRATE 10 MG TAB: 10 | 90 days supply | Qty: 90 | Fill #0

## 2019-03-25 ENCOUNTER — Other Ambulatory Visit: Payer: Self-pay | Admitting: Internal Medicine

## 2019-03-25 MED FILL — clonazePAM 0.5 MG TABS: 0.5 | 15 days supply | Qty: 30 | Fill #0

## 2019-03-25 NOTE — Telephone Encounter (Signed)
Clonazepam refill e-sent 

## 2019-03-25 NOTE — Telephone Encounter (Signed)
Is this appropriate for refill ? 

## 2019-04-01 MED FILL — RIZATRIPTAN BENZOATE 10 MG: 10 | 30 days supply | Qty: 18 | Fill #1

## 2019-04-01 MED FILL — ARMOUR THYROID 15 MG TABLET: 15 | 30 days supply | Qty: 30 | Fill #1

## 2019-04-09 MED FILL — clonazePAM 0.5 MG TABS: 0.5 | 15 days supply | Qty: 30 | Fill #1

## 2019-04-16 MED FILL — clonazePAM 0.5 MG TABS: 0.5 | 15 days supply | Qty: 30 | Fill #1

## 2019-04-30 MED FILL — ARMOUR THYROID 15 MG TABLET: 15 | 30 days supply | Qty: 30 | Fill #2

## 2019-04-30 MED FILL — clonazePAM 0.5 MG TABS: 0.5 | 15 days supply | Qty: 30 | Fill #2

## 2019-05-15 MED FILL — RIZATRIPTAN BENZOATE 10 MG: 10 | 30 days supply | Qty: 18 | Fill #2

## 2019-05-15 MED FILL — clonazePAM 0.5 MG TABS: 0.5 | 15 days supply | Qty: 30 | Fill #3

## 2019-05-20 MED FILL — AMPHETAMINE-DEXTROAMPHETAMI: 10 | 30 days supply | Qty: 30 | Fill #0

## 2019-05-29 MED FILL — ARMOUR THYROID 15 MG TABLET: 15 | 30 days supply | Qty: 30 | Fill #3

## 2019-05-31 MED FILL — clonazePAM 0.5 MG TABS: 0.5 | 15 days supply | Qty: 30 | Fill #4

## 2019-06-17 MED FILL — RIZATRIPTAN BENZOATE 10 MG: 10 | 30 days supply | Qty: 18 | Fill #0

## 2019-06-17 MED FILL — clonazePAM 0.5 MG TABS: 0.5 | 15 days supply | Qty: 30 | Fill #5

## 2019-06-19 MED FILL — ZOLPIDEM TARTRATE 10 MG TAB: 10 | 90 days supply | Qty: 90 | Fill #0

## 2019-07-02 ENCOUNTER — Other Ambulatory Visit: Payer: Self-pay | Admitting: Internal Medicine

## 2019-07-03 ENCOUNTER — Telehealth: Payer: Self-pay | Admitting: Internal Medicine

## 2019-07-03 MED ORDER — CLONAZEPAM 0.5 MG PO TABS: ORAL_TABLET | ORAL | 5 refills | Status: DC

## 2019-07-03 MED FILL — clonazePAM 0.5 MG TABS: 0.5 | 15 days supply | Qty: 30 | Fill #0

## 2019-07-03 NOTE — Telephone Encounter (Signed)
Please advise on clonazepam refill  Pt last seen 07/13/18 and no ov pending  Last rx  Dose, Route, Frequency: As Directed  Dispense Quantity: 30 tablet Refills: 5 Fills remaining: --        Sig: TAKE 1-2 TABLETS BY MOUTH DAILY AS NEEDED FOR SLEEP       Written Date: 03/25/19 Expiration Date: 09/21/19    Start Date: 03/25/19 End Date: --         Ordering Provider: -- DEA #:  -- NPI:  --   Authorizing Provider: Deneise Lever, MD DEA #:  [id number] NPI:  [id number]   Ordering User:  Deneise Lever, MD

## 2019-07-03 NOTE — Telephone Encounter (Signed)
Clonazepam refill e-sent 

## 2019-07-19 MED FILL — clonazePAM 0.5 MG TABS: 0.5 | 15 days supply | Qty: 30 | Fill #1

## 2019-08-03 MED FILL — clonazePAM 0.5 MG TABS: 0.5 | 15 days supply | Qty: 30 | Fill #2

## 2019-08-07 MED FILL — GENTAMICIN 3 MG/ML EYE DRP: 0.3 | 16 days supply | Qty: 5 | Fill #0

## 2019-08-07 MED FILL — RIZATRIPTAN BENZOATE 10 MG: 10 | 30 days supply | Qty: 18 | Fill #1

## 2019-08-12 MED FILL — AMPHETAMINE-DEXTROAMPHETAMI: 10 | 30 days supply | Qty: 30 | Fill #0

## 2019-08-19 MED FILL — clonazePAM 0.5 MG TABS: 0.5 | 15 days supply | Qty: 30 | Fill #3

## 2019-09-03 MED FILL — clonazePAM 0.5 MG TABS: 0.5 | 15 days supply | Qty: 30 | Fill #4

## 2019-09-09 MED FILL — FINASTERIDE 1 MG TABS: 1 | 90 days supply | Qty: 90 | Fill #0

## 2019-09-13 MED FILL — DOXYCYCLINE HYC 100 MG CAPS: 100 | 10 days supply | Qty: 20 | Fill #0

## 2019-09-13 MED FILL — FLUTICASONE PROP 50 MCG SPR: 50 | 30 days supply | Qty: 16 | Fill #0

## 2019-09-18 MED FILL — clonazePAM 0.5 MG TABS: 0.5 | 15 days supply | Qty: 30 | Fill #5

## 2019-09-23 MED FILL — ZOLPIDEM TARTRATE 10 MG TAB: 10 | 90 days supply | Qty: 90 | Fill #0

## 2019-10-02 MED FILL — RIZATRIPTAN BENZOATE 10 MG: 10 | 30 days supply | Qty: 18 | Fill #0

## 2019-10-03 ENCOUNTER — Other Ambulatory Visit: Payer: Self-pay | Admitting: Internal Medicine

## 2019-10-03 MED FILL — clonazePAM 0.5 MG TABS: 0.5 | 15 days supply | Qty: 30 | Fill #0

## 2019-10-03 NOTE — Telephone Encounter (Signed)
Dr. Annamaria Boots, please advise if you are okay refilling med for pt.  Allergies  Allergen Reactions  . Penicillins Anaphylaxis    Has patient had a PCN reaction causing immediate rash, facial/tongue/throat swelling, SOB or lightheadedness with hypotension: Yes Has patient had a PCN reaction causing severe rash involving mucus membranes or skin necrosis: No Has patient had a PCN reaction that required hospitalization No Has patient had a PCN reaction occurring within the last 10 years: Yes If all of the above answers are "NO", then may proceed with Cephalosporin use.      Current Outpatient Medications:  .  amphetamine-dextroamphetamine (ADDERALL) 20 MG tablet, Take 20 mg by mouth daily., Disp: , Rfl:  .  BIOTIN PO, Take 1 tablet by mouth daily., Disp: , Rfl:  .  CALCIUM PO, Take 1 tablet by mouth daily., Disp: , Rfl:  .  Cholecalciferol (VITAMIN D PO), Take 1 tablet by mouth daily. , Disp: , Rfl:  .  clonazePAM (KLONOPIN) 0.5 MG tablet, TAKE 1-2 TABLETS BY MOUTH DAILY AS NEEDED FOR SLEEP, Disp: 30 tablet, Rfl: 5 .  ibuprofen (ADVIL,MOTRIN) 200 MG tablet, Take 600 mg by mouth every 6 (six) hours as needed for moderate pain., Disp: , Rfl:  .  Multiple Vitamin (MULITIVITAMIN WITH MINERALS) TABS, Take 1 tablet by mouth daily., Disp: , Rfl:  .  rizatriptan (MAXALT) 10 MG tablet, Take 10 mg by mouth daily as needed for migraine., Disp: , Rfl: 3 .  zolpidem (AMBIEN) 10 MG tablet, Take 10 mg by mouth at bedtime., Disp: , Rfl: 1

## 2019-10-03 NOTE — Telephone Encounter (Signed)
Clonazepam refill e-sent 

## 2019-10-18 MED FILL — clonazePAM 0.5 MG TABS: 0.5 | 15 days supply | Qty: 30 | Fill #1

## 2019-10-28 MED FILL — ARMOUR THYROID 15 MG TABLET: 15 | 30 days supply | Qty: 30 | Fill #0

## 2019-11-03 ENCOUNTER — Emergency Department (HOSPITAL_COMMUNITY): Payer: No Typology Code available for payment source

## 2019-11-03 ENCOUNTER — Other Ambulatory Visit: Payer: Self-pay

## 2019-11-03 ENCOUNTER — Encounter (HOSPITAL_COMMUNITY): Payer: Self-pay

## 2019-11-03 ENCOUNTER — Emergency Department (HOSPITAL_COMMUNITY)
Admission: EM | Admit: 2019-11-03 | Discharge: 2019-11-03 | Disposition: A | Discharge status: Home / Self Care | Payer: No Typology Code available for payment source | Attending: Emergency Medicine | Admitting: Emergency Medicine

## 2019-11-03 DIAGNOSIS — Z853 Personal history of malignant neoplasm of breast: Secondary | ICD-10-CM | POA: Diagnosis not present

## 2019-11-03 DIAGNOSIS — E1165 Type 2 diabetes mellitus with hyperglycemia: Secondary | ICD-10-CM | POA: Diagnosis not present

## 2019-11-03 DIAGNOSIS — R55 Syncope and collapse: Secondary | ICD-10-CM | POA: Insufficient documentation

## 2019-11-03 DIAGNOSIS — W19XXXA Unspecified fall, initial encounter: Secondary | ICD-10-CM | POA: Diagnosis not present

## 2019-11-03 DIAGNOSIS — R5383 Other fatigue: Secondary | ICD-10-CM | POA: Diagnosis present

## 2019-11-03 DIAGNOSIS — R0689 Other abnormalities of breathing: Secondary | ICD-10-CM | POA: Diagnosis not present

## 2019-11-03 DIAGNOSIS — Z87891 Personal history of nicotine dependence: Secondary | ICD-10-CM | POA: Diagnosis not present

## 2019-11-03 DIAGNOSIS — I951 Orthostatic hypotension: Secondary | ICD-10-CM | POA: Diagnosis not present

## 2019-11-03 DIAGNOSIS — Z79899 Other long term (current) drug therapy: Secondary | ICD-10-CM | POA: Insufficient documentation

## 2019-11-03 DIAGNOSIS — I959 Hypotension, unspecified: Secondary | ICD-10-CM | POA: Diagnosis not present

## 2019-11-03 LAB — CBG MONITORING, ED: Glucose-Capillary: 112 mg/dL — ABNORMAL HIGH (ref 70–99)

## 2019-11-03 LAB — CBC
HCT: 36.2 % (ref 36.0–46.0)
Hemoglobin: 12 g/dL (ref 12.0–15.0)
MCH: 32.3 pg (ref 26.0–34.0)
MCHC: 33.1 g/dL (ref 30.0–36.0)
MCV: 97.3 fL (ref 80.0–100.0)
Platelets: 301 10*3/uL (ref 150–400)
RBC: 3.72 MIL/uL — ABNORMAL LOW (ref 3.87–5.11)
RDW: 12.1 % (ref 11.5–15.5)
WBC: 8.8 10*3/uL (ref 4.0–10.5)
nRBC: 0 % (ref 0.0–0.2)

## 2019-11-03 LAB — URINALYSIS, ROUTINE W REFLEX MICROSCOPIC
Bilirubin Urine: NEGATIVE
Glucose, UA: NEGATIVE mg/dL
Hgb urine dipstick: NEGATIVE
Ketones, ur: 20 mg/dL — AB
Leukocytes,Ua: NEGATIVE
Nitrite: NEGATIVE
Protein, ur: NEGATIVE mg/dL
Specific Gravity, Urine: 1.023 (ref 1.005–1.030)
pH: 5 (ref 5.0–8.0)

## 2019-11-03 LAB — BASIC METABOLIC PANEL
Anion gap: 11 (ref 5–15)
BUN: 52 mg/dL — ABNORMAL HIGH (ref 6–20)
CO2: 21 mmol/L — ABNORMAL LOW (ref 22–32)
Calcium: 9.1 mg/dL (ref 8.9–10.3)
Chloride: 109 mmol/L (ref 98–111)
Creatinine, Ser: 0.67 mg/dL (ref 0.44–1.00)
GFR calc Af Amer: >60 mL/min (ref >60)
GFR calc non Af Amer: >60 mL/min (ref >60)
Glucose, Bld: 123 mg/dL — ABNORMAL HIGH (ref 70–99)
Potassium: 4.2 mmol/L (ref 3.5–5.1)
Sodium: 141 mmol/L (ref 135–145)

## 2019-11-03 LAB — I-STAT BETA HCG BLOOD, ED (MC, WL, AP ONLY): I-stat hCG, quantitative: <5 m[IU]/mL (ref <5)

## 2019-11-03 LAB — TROPONIN I (HIGH SENSITIVITY): Troponin I (High Sensitivity): 3 ng/L (ref <18)

## 2019-11-03 LAB — D-DIMER, QUANTITATIVE (NOT AT ARMC): D-Dimer, Quant: <0.27 ug/mL-FEU (ref 0.00–0.50)

## 2019-11-03 MED ORDER — SODIUM CHLORIDE 0.9% FLUSH
3.0000 mL | Freq: Once | INTRAVENOUS | Status: AC
  Administered 2019-11-03: 3 mL via INTRAVENOUS

## 2019-11-03 MED ORDER — SODIUM CHLORIDE 0.9 % IV BOLUS
1000.0000 mL | Freq: Once | INTRAVENOUS | Status: AC
  Administered 2019-11-03: 1000 mL via INTRAVENOUS

## 2019-11-03 MED FILL — clonazePAM 0.5 MG TABS: 0.5 | 15 days supply | Qty: 30 | Fill #2

## 2019-11-03 NOTE — ED Provider Notes (Signed)
[facility] Provider Note   CSN: [id number] Arrival date & time: 11/03/19  1913     History Chief Complaint  Patient presents with  . Loss of Consciousness    [facility] Nunez is a 59 y.o. female  W/ pmhx of breast cancer presenting to ED with fatigue and near syncope.  Patient reports she woke up this morning and felt immediately fatigued and try to get out of bed.  She specifically of standing up and walking she felt extremely lightheaded.  She feels sluggish and tired.  She went to the bathroom later this afternoon and apparently lost consciousness while using the bathroom.  This is never happened to her before.  She called 911 and EMS reports the patient a blood pressure of 80/60 on scene.  Gave her 200 cc of normal saline with some improvement of her pressure.  Currently the emergency department the patient is not feeling lightheaded.  She has no chest pain or shortness of breath and did not have any earlier today.  She denies fevers chills or abdominal pain.  She reports she feels generally fatigued and tired.  She did get both Covid vaccines.  She reports some allergy congestion this week this is a chronic problem for her.  She denies sore throat, headaches, myalgias, diarrhea.  She denies any cardiac history.  She denies any history of blood clots in her legs or her lungs.  She is normally very active and exercises regularly.  Yesterday she went on a 5 mile bike ride which is not unusual for her.  She says she keeps her self very well-hydrated.   HPI     Past Medical History:  Diagnosis Date  . Allergy   . Breast cancer ([place]) 2006   ER-/PR-/her2+  . Menopausal and perimenopausal disorder   . Migraines     Patient Active Problem List   Diagnosis Date Noted  . Migraines 06/26/2017  . Cellulitis of left arm 06/25/2017  . Insomnia 01/29/2017    Past Surgical History:  Procedure Laterality Date  . BREAST SURGERY    . CESAREAN  SECTION    . lymph node resection    . MASTECTOMY       OB History   No obstetric history on file.     Family History  Problem Relation Age of Onset  . Renal cancer Mother 79  . Esophageal cancer Father 29  . Heart disease Father   . Breast cancer Paternal Aunt        dx in her 83s  . Uterine cancer Maternal Grandmother 51    Social History   Tobacco Use  . Smoking status: Former Smoker    Packs/day: 0.20    Years: 10.00    Pack years: 2.00    Types: Cigarettes    Quit date: 01/25/1997    Years since quitting: 22.7  . Smokeless tobacco: Never Used  Substance Use Topics  . Alcohol use: Yes    Comment: occasional  . Drug use: No    Home Medications Prior to Admission medications   Medication Sig Start Date End Date Taking? Authorizing Provider  amphetamine-dextroamphetamine (ADDERALL) 10 MG tablet Take 10 mg by mouth daily as needed (to focus).  08/12/19  Yes [provider]  Ascorbic Acid (VITAMIN C PO) Take 1 tablet by mouth daily.   Yes [provider]  b complex vitamins tablet Take 1 tablet by mouth daily.   Yes [provider]  CALCIUM PO Take 2 tablets by mouth 2 (two) times daily.    Yes [provider]  Cholecalciferol (VITAMIN D PO) Take 1 tablet by mouth daily.    Yes [provider]  clonazePAM (KLONOPIN) 0.5 MG tablet TAKE 1 TO 2 TABLETS BY MOUTH DAILY AS NEEDED FOR SLEEP Patient taking differently: Take 1 mg by mouth at bedtime.  10/03/19  Yes Young, Tarri Fuller D, MD  finasteride (PROPECIA) 1 MG tablet Take 1 mg by mouth 2 (two) times daily.  09/09/19  Yes [provider]  ibuprofen (ADVIL,MOTRIN) 200 MG tablet Take 600 mg by mouth every 6 (six) hours as needed for moderate pain.   Yes [provider]  Multiple Vitamin (MULITIVITAMIN WITH MINERALS) TABS Take 1 tablet by mouth daily.   Yes [provider]  PRESCRIPTION MEDICATION See admin instructions. Inject one testosterone tablet  intramuscularly into hip once every 5-6 months - administered by Lyn Henri MD   Yes [provider]  rizatriptan (MAXALT) 10 MG tablet Take 10 mg by mouth daily as needed for migraine. 03/07/16  Yes [provider]  thyroid (ARMOUR) 15 MG tablet Take 15 mg by mouth daily before breakfast.   Yes [provider]  zolpidem (AMBIEN) 10 MG tablet Take 10 mg by mouth at bedtime. 03/07/16  Yes [provider]    Allergies    Penicillins  Review of Systems   Review of Systems  Constitutional: Negative for chills and fever.  Eyes: Negative for photophobia and visual disturbance.  Respiratory: Negative for cough and shortness of breath.   Cardiovascular: Negative for chest pain and palpitations.  Gastrointestinal: Negative for abdominal pain and vomiting.  Skin: Negative for color change and rash.  Neurological: Positive for dizziness, syncope and light-headedness. Negative for facial asymmetry, speech difficulty and headaches.  All other systems reviewed and are negative.   Physical Exam Updated Vital Signs BP (!) 98/56   Pulse 73   Temp 97.9 F (36.6 C) (Oral)   Resp 18   SpO2 98%   Physical Exam Vitals and nursing note reviewed.  Constitutional:      General: She is not in acute distress.    Appearance: She is well-developed.  HENT:     Head: Normocephalic and atraumatic.  Eyes:     Conjunctiva/sclera: Conjunctivae normal.     Pupils: Pupils are equal, round, and reactive to light.  Cardiovascular:     Rate and Rhythm: Normal rate and regular rhythm.     Pulses: Normal pulses.     Heart sounds: No murmur.  Pulmonary:     Effort: Pulmonary effort is normal. No respiratory distress.     Breath sounds: Normal breath sounds.  Abdominal:     Palpations: Abdomen is soft.     Tenderness: There is no abdominal tenderness.  Musculoskeletal:     Cervical back: Neck supple.  Skin:    General: Skin is warm and dry.  Neurological:     General: No  focal deficit present.     Mental Status: She is alert and oriented to person, place, and time.     Sensory: No sensory deficit.     Motor: No weakness.  Psychiatric:        Mood and Affect: Mood normal.        Behavior: Behavior normal.     ED Results / Procedures / Treatments   Labs (all labs ordered are listed, but only abnormal results are displayed) Labs Reviewed  BASIC  METABOLIC PANEL - Abnormal; Notable for the following components:      Result Value   CO2 21 (*)    Glucose, Bld 123 (*)    BUN 52 (*)    All other components within normal limits  CBC - Abnormal; Notable for the following components:   RBC 3.72 (*)    All other components within normal limits  URINALYSIS, ROUTINE W REFLEX MICROSCOPIC - Abnormal; Notable for the following components:   APPearance HAZY (*)    Ketones, ur 20 (*)    All other components within normal limits  CBG MONITORING, ED - Abnormal; Notable for the following components:   Glucose-Capillary 112 (*)    All other components within normal limits  D-DIMER, QUANTITATIVE (NOT AT [facility])  I-STAT BETA HCG BLOOD, ED ([facility], [facility], [facility] ONLY)  TROPONIN I (HIGH SENSITIVITY)    EKG EKG Interpretation  Date/Time:  Sunday November 03 2019 19:49:58 EDT Ventricular Rate:  94 PR Interval:    QRS Duration: 84 QT Interval:  347 QTC Calculation: 434 R Axis:   75 Text Interpretation: Sinus rhythm Borderline T wave abnormalities No STEMI Confirmed by Octaviano Glow [phone]) on 11/03/2019 7:51:04 PM   Radiology DG Chest 1 View  Result Date: 11/03/2019 CLINICAL DATA:  Near syncope. EXAM: CHEST  1 VIEW COMPARISON:  None. FINDINGS: The cardiomediastinal contours are normal. The lungs are clear. Pulmonary vasculature is normal. No consolidation, pleural effusion, or pneumothorax. No acute osseous abnormalities are seen. Surgical clips project over both breasts and left axilla. IMPRESSION: No acute chest findings. Electronically Signed   By: Keith Rake M.D.    On: 11/03/2019 21:03    Procedures Procedures (including critical care time)  Medications Ordered in ED Medications  sodium chloride flush (NS) 0.9 % injection 3 mL (3 mLs Intravenous Given 11/03/19 2017)  sodium chloride 0.9 % bolus 1,000 mL (0 mLs Intravenous Stopped 11/03/19 2154)  sodium chloride 0.9 % bolus 1,000 mL (0 mLs Intravenous Stopped 11/03/19 2250)    ED Course  I have reviewed the triage vital signs and the nursing notes.  Pertinent labs & imaging results that were available during my care of the patient were reviewed by me and considered in my medical decision making (see chart for details).  59 yo female presenting to the ED with lightheadedness beginning today, significantly worse with standing and ambulation.  Also feels tired.  She went on a 5 mile bicycle ride yesterday but says this is not an unusual workout for her.  I am doubtful of rhabdo.  However her orthostatic vital signs are notable positive here, with a BP drop into the 97'Q systolic with standing and her feeling lightheaded.  Her elevated BUN also is suggestive of volume depletion.  We gave her 2L IVF here.  After the first liter, she stood to use the bathroom and said she felt "a little woozy still, but better."  After the 2nd liter she was able to stand without wooziness and felt "a lot better."    With this clinical setting, I think it's reasonable to discharge her home and have her follow up with a cardiologist as an outpatient.  If she is still symptomatic after continued volume repletion this week, a cardiologist may wish to obtain an echocardiogram.    However, given her benign workup and improvement with fluids here, I do not believe she needs hospitalization at this time.  *  DDimer negative, no respiratory symptoms, low suspicion for PE  Hgb normal  Troponin 3 with > 6 hours symptoms.  I do not suspect ACS  No cardiomegaly, leg swelling, pulmonary edema, or orthopnea to suggest new onset of  CHF  She tells me she's had both COVID vaccine doses, and no other infectious symptoms.  Doubt COVID    Clinical Course as of Nov 03 109  Sun Nov 03, 2019  2128 Ortho vitals positive with significant BP drop with standing and dizziness.  BUN also elevated indicative of likely dehydration.  We're giving fluids now.   [MT]  2306 She is feeling significantly better after 2 L of fluids. She is able to walk without any dizziness. I suspect this is likely dehydration and orthostatic hypotension. Advised her to keep drinking interest tomorrow. I did advise her to follow-up with a cardiologist as an outpatient if her symptoms are not improving, she may need an echocardiogram. However at this time given her benign work-up I think is reasonable to discharge her home. She agrees with this plan.   [MT]    Clinical Course User Index [MT] Stanley Lyness, Carola Rhine, MD    Final Clinical Impression(s) / ED Diagnoses Final diagnoses:  Orthostatic hypotension    Rx / DC Orders ED Discharge Orders    None       Dennard Vezina, Carola Rhine, MD 11/04/19 [phone]

## 2019-11-03 NOTE — ED Triage Notes (Signed)
Pt bib [facility] from home w/ c/o syncope. Pt states she has "not been feeling well" since 1200 today, and she got up to pee at approx 1730 and had syncopal episode. Pt's BP was 80/60 when EMS got to scene. Pt has received 200 mL of NS w/ BP up to 100/70. Pt AOx4, neuro intact, states she is just feeling "tired".

## 2019-11-03 NOTE — ED Notes (Signed)
[facility] N[facility]z, husband, [phone] would like an update on pt.

## 2019-11-07 MED FILL — FINASTERIDE 1 MG TABS: 1 | 90 days supply | Qty: 90 | Fill #0

## 2019-11-13 NOTE — Progress Notes (Signed)
Cardiology Office Note:   Date:  11/14/2019  NAME:  Jodi Nunez    MRN: [id number] DOB:  02-02-1961   PCP:  [name], MD  Cardiologist:  No primary care provider on file.   Referring MD: [name], MD   Chief Complaint  Patient presents with  . New Patient (Initial Visit)  . Loss of Consciousness    Near.  . Hypotension    Orthostatic.   History of Present Illness:   Jodi Nunez is a 59 y.o. female with a hx of breast cancer who is being seen today for the evaluation of orthostatic hypotension at the request of [name], MD.  She was evaluated emergency room on 11/03/2019 with hypotension.  Orthostatics were positive.  She was given fluids with resolution of symptoms.  Sent to follow-up with cardiology today.  EKG demonstrated normal sinus rhythm heart rate 94 nonspecific ST-T changes.  Troponin was negative.  She was evaluated the emergency room after a syncopal episode.  She reports on Saturday she did go on a very extensive bike ride with a friend.  She rode 5 to 6 miles on her bike.  She remained well-hydrated.  She reports on Sunday morning when she woke up she felt lightheaded and dizzy.  She did go about the rest of her morning eating breakfast and drinking plenty of fluid.  She reports that when she went to check on her husband she became dizzy when she stood up she reports that she fell down on her knees and was quite pale.  She reports that she then blacked out briefly for about 1 minute.  She reports she was awoken with her husband over her who was calling 911.  She did come to very quickly.  There is no seizure activity reported.  There is no loss of urine or bowels.  She reports she came to very quickly.  She denies any chest pain, shortness of breath, palpitations prior to the event.  She denies any of those symptoms after the event.  She was then evaluated the emergency room and was profoundly orthostatic.  She was given 2 L of fluid with  resolution of symptoms her EKG today is in normal sinus rhythm.  Her EKG was normal then as well.  Her troponins were negative as well.  She was instructed to follow-up with cardiology.  She has no real CVD risk factors other than hyperlipidemia.  This is controlled with diet and exercise.  Most recent LDL cholesterol 102.  She is not diabetic.  She has remote history of breast cancer which required lumpectomy and radiation.  She is cancer free this was 20 years ago.  She does have a family history of heart disease in her father.  This apparently was a one-time episode.  No further episodes described.  Past Medical History: Past Medical History:  Diagnosis Date  . Allergy   . Breast cancer ([place]) 2006   ER-/PR-/her2+  . Menopausal and perimenopausal disorder   . Migraines     Past Surgical History: Past Surgical History:  Procedure Laterality Date  . BREAST SURGERY    . CESAREAN SECTION    . lymph node resection    . MASTECTOMY      Current Medications: Current Meds  Medication Sig  . amphetamine-dextroamphetamine (ADDERALL) 10 MG tablet Take 10 mg by mouth daily as needed (to focus).   . Ascorbic Acid (VITAMIN C PO) Take 1 tablet by mouth daily.  Marland Kitchen  b complex vitamins tablet Take 1 tablet by mouth daily.  Marland Kitchen CALCIUM PO Take 2 tablets by mouth 2 (two) times daily.   . Cholecalciferol (VITAMIN D PO) Take 1 tablet by mouth daily.   . clonazePAM (KLONOPIN) 0.5 MG tablet TAKE 1 TO 2 TABLETS BY MOUTH DAILY AS NEEDED FOR SLEEP (Patient taking differently: Take 1 mg by mouth at bedtime. )  . finasteride (PROPECIA) 1 MG tablet Take 1 mg by mouth 2 (two) times daily.   Marland Kitchen ibuprofen (ADVIL,MOTRIN) 200 MG tablet Take 600 mg by mouth every 6 (six) hours as needed for moderate pain.  . Multiple Vitamin (MULITIVITAMIN WITH MINERALS) TABS Take 1 tablet by mouth daily.  Marland Kitchen PRESCRIPTION MEDICATION See admin instructions. Inject one testosterone tablet intramuscularly into hip once every 5-6 months -  administered by [facility] MD  . rizatriptan (MAXALT) 10 MG tablet Take 10 mg by mouth daily as needed for migraine.  . thyroid (ARMOUR) 15 MG tablet Take 15 mg by mouth daily before breakfast.  . zolpidem (AMBIEN) 10 MG tablet Take 10 mg by mouth at bedtime.     Allergies:    Penicillins   Social History: Social History   Socioeconomic History  . Marital status: Married    Spouse name: Not on file  . Number of children: Not on file  . Years of education: Not on file  . Highest education level: Not on file  Occupational History  . Not on file  Tobacco Use  . Smoking status: Former Smoker    Packs/day: 0.20    Years: 10.00    Pack years: 2.00    Types: Cigarettes    Quit date: 01/25/1997    Years since quitting: 22.8  . Smokeless tobacco: Never Used  Substance and Sexual Activity  . Alcohol use: Yes    Comment: occasional  . Drug use: No  . Sexual activity: Not on file  Other Topics Concern  . Not on file  Social History Narrative  . Not on file   Social Determinants of Health   Financial Resource Strain:   . Difficulty of Paying Living Expenses:   Food Insecurity:   . Worried About Charity fundraiser in the Last Year:   . Arboriculturist in the Last Year:   Transportation Needs:   . Film/video editor (Medical):   Marland Kitchen Lack of Transportation (Non-Medical):   Physical Activity:   . Days of Exercise per Week:   . Minutes of Exercise per Session:   Stress:   . Feeling of Stress :   Social Connections:   . Frequency of Communication with Friends and Family:   . Frequency of Social Gatherings with Friends and Family:   . Attends Religious Services:   . Active Member of Clubs or Organizations:   . Attends Archivist Meetings:   Marland Kitchen Marital Status:      Family History: The patient's family history includes Breast cancer in her paternal aunt; Esophageal cancer (age of onset: 48) in her father; Heart disease in her father; Renal cancer (age of onset: 75)  in her mother; Uterine cancer (age of onset: 32) in her maternal grandmother.  ROS:   All other ROS reviewed and negative. Pertinent positives noted in the HPI.     EKGs/Labs/Other Studies Reviewed:   The following studies were personally reviewed by me today:  EKG:  EKG is ordered today.  The ekg ordered today demonstrates normal sinus rhythm, heart rate  67, no acute ST-T changes, notes prior infarction, and was personally reviewed by me.   Recent Labs: 11/03/2019: BUN 52; Creatinine, Ser 0.67; Hemoglobin 12.0; Platelets 301; Potassium 4.2; Sodium 141   Recent Lipid Panel No results found for: CHOL, TRIG, HDL, CHOLHDL, VLDL, LDLCALC, LDLDIRECT  Physical Exam:   VS:  BP 106/66 (BP Location: Right Arm, Patient Position: Sitting, Cuff Size: Normal)   Pulse 67   Ht 5' 3"  (1.6 m)   Wt 138 lb (62.6 kg)   BMI 24.45 kg/m    Wt Readings from Last 3 Encounters:  11/14/19 138 lb (62.6 kg)  07/13/18 148 lb (67.1 kg)  05/01/18 148 lb (67.1 kg)    General: Well nourished, well developed, in no acute distress Heart: Atraumatic, normal size  Eyes: PEERLA, EOMI  Neck: Supple, no JVD Endocrine: No thryomegaly Cardiac: Normal S1, S2; RRR; no murmurs, rubs, or gallops Lungs: Clear to auscultation bilaterally, no wheezing, rhonchi or rales  Abd: Soft, nontender, no hepatomegaly  Ext: No edema, pulses 2+ Musculoskeletal: No deformities, BUE and BLE strength normal and equal Skin: Warm and dry, no rashes   Neuro: Alert and oriented to person, place, time, and situation, CNII-XII grossly intact, no focal deficits  Psych: Normal mood and affect   ASSESSMENT:   Adhya Cocco is a 59 y.o. female who presents for the following: 1. Orthostatic hypotension   2. Syncope and collapse     PLAN:   1. Orthostatic hypotension 2. Syncope and collapse -She was clearly orthostatic in the emergency room.  Her EKG today is in normal sinus rhythm with no acute changes.  Her cardiovascular examination is  benign.  I hear no murmurs rubs or gallops.  She has no evidence of cardiovascular disease on my examination.  I believe this is a clear case of orthostatic hypotension.  She likely had a vasovagal event of some sort as well.  Given that her symptoms have resolved and she is much better, I see no need for further cardiac work-up.  Her cardiac enzymes were negative in the emergency room.  She has no symptoms of cardiovascular disease.  She is quite healthy exercising several miles per day doing bike rides and completing yoga.  She will reach back out to [place] if this event happens again.  Overall this is a low risk episode that does not require further testing.   Disposition: Return if symptoms worsen or fail to improve.  Medication Adjustments/Labs and Tests Ordered: Current medicines are reviewed at length with the patient today.  Concerns regarding medicines are outlined above.  Orders Placed This Encounter  Procedures  . EKG 12-Lead   No orders of the defined types were placed in this encounter.   Patient Instructions  Medication Instructions:  The current medical regimen is effective;  continue present plan and medications.  *If you need a refill on your cardiac medications before your next appointment, please call your pharmacy*   Follow-Up: At [facility], you and your health needs are our priority.  As part of our continuing mission to provide you with exceptional heart care, we have created designated Provider Care Teams.  These Care Teams include your primary Cardiologist (physician) and Advanced Practice Providers (APPs -  Physician Assistants and Nurse Practitioners) who all work together to provide you with the care you need, when you need it.  We recommend signing up for the patient portal called "MyChart".  Sign up information is provided on this After Visit Summary.  MyChart is used to connect with patients for Virtual Visits (Telemedicine).  Patients are able to view lab/test  results, encounter notes, upcoming appointments, etc.  Non-urgent messages can be sent to your provider as well.   To learn more about what you can do with MyChart, go to [email or web address].    Your next appointment:   As needed  The format for your next appointment:   In Person  Provider:   Eleonore Chiquito, MD      Signed, Addison Naegeli. Audie Box, [place]  [address] [place], [place] [address] [phone]  11/14/2019 5:02 PM

## 2019-11-14 ENCOUNTER — Encounter: Payer: Self-pay | Admitting: Cardiovascular Disease

## 2019-11-14 ENCOUNTER — Ambulatory Visit (INDEPENDENT_AMBULATORY_CARE_PROVIDER_SITE_OTHER): Payer: No Typology Code available for payment source | Admitting: Cardiovascular Disease

## 2019-11-14 ENCOUNTER — Other Ambulatory Visit: Payer: Self-pay

## 2019-11-14 VITALS — BP 106/66 | HR 67 | Ht 63.0 in | Wt 138.0 lb

## 2019-11-14 DIAGNOSIS — I951 Orthostatic hypotension: Secondary | ICD-10-CM

## 2019-11-14 DIAGNOSIS — R55 Syncope and collapse: Secondary | ICD-10-CM | POA: Diagnosis not present

## 2019-11-14 NOTE — Patient Instructions (Signed)
Medication Instructions:  The current medical regimen is effective;  continue present plan and medications.  *If you need a refill on your cardiac medications before your next appointment, please call your pharmacy*    Follow-Up: At [facility], you and your health needs are our priority.  As part of our continuing mission to provide you with exceptional heart care, we have created designated Provider Care Teams.  These Care Teams include your primary Cardiologist (physician) and Advanced Practice Providers (APPs -  Physician Assistants and Nurse Practitioners) who all work together to provide you with the care you need, when you need it.  We recommend signing up for the patient portal called "MyChart".  Sign up information is provided on this After Visit Summary.  MyChart is used to connect with patients for Virtual Visits (Telemedicine).  Patients are able to view lab/test results, encounter notes, upcoming appointments, etc.  Non-urgent messages can be sent to your provider as well.   To learn more about what you can do with MyChart, go to [email or web address].    Your next appointment:   As needed  The format for your next appointment:   In Person  Provider:   Amado O'Neal, MD      

## 2019-11-21 MED FILL — clonazePAM 0.5 MG TABS: 0.5 | 15 days supply | Qty: 30 | Fill #3

## 2019-11-24 MED FILL — RIZATRIPTAN BENZOATE 10 MG: 10 | 30 days supply | Qty: 18 | Fill #1

## 2019-11-25 MED FILL — AMPHETAMINE-DEXTROAMPHETAMI: 10 | 30 days supply | Qty: 30 | Fill #0

## 2019-11-25 MED FILL — ARMOUR THYROID 15 MG TABLET: 15 | 30 days supply | Qty: 30 | Fill #1

## 2019-12-05 MED FILL — clonazePAM 0.5 MG TABS: 0.5 | 15 days supply | Qty: 30 | Fill #4

## 2019-12-23 MED FILL — ARMOUR THYROID 15 MG TABLET: 15 | 30 days supply | Qty: 30 | Fill #2

## 2019-12-23 MED FILL — ZOLPIDEM TARTRATE 10 MG TAB: 10 | 90 days supply | Qty: 90 | Fill #0

## 2019-12-24 MED FILL — RIZATRIPTAN BENZOATE 10 MG: 10 | 30 days supply | Qty: 18 | Fill #2

## 2019-12-24 MED FILL — clonazePAM 0.5 MG TABS: 0.5 | 15 days supply | Qty: 30 | Fill #5

## 2020-01-11 ENCOUNTER — Other Ambulatory Visit: Payer: Self-pay | Admitting: Internal Medicine

## 2020-01-13 ENCOUNTER — Other Ambulatory Visit: Payer: Self-pay | Admitting: *Deleted

## 2020-01-13 ENCOUNTER — Telehealth: Payer: Self-pay | Admitting: *Deleted

## 2020-01-13 ENCOUNTER — Other Ambulatory Visit: Payer: Self-pay | Admitting: Internal Medicine

## 2020-01-13 MED ORDER — CLONAZEPAM 0.5 MG PO TABS: ORAL_TABLET | ORAL | 5 refills | Status: DC

## 2020-01-13 MED FILL — clonazePAM 0.5 MG TABS: 0.5 | 15 days supply | Qty: 30 | Fill #0

## 2020-01-13 NOTE — Telephone Encounter (Signed)
Clonazepam refill e-sent to [place]

## 2020-01-13 NOTE — Telephone Encounter (Signed)
ATC pt, no answer. Left message for pt to call back.  

## 2020-01-14 NOTE — Telephone Encounter (Signed)
Spoke with patient. She is aware that her refill has been sent in to the pharmacy for her.   Nothing further needed at time of call.

## 2020-01-15 MED FILL — AMPHETAMINE-DEXTROAMPHETAMI: 10 | 30 days supply | Qty: 30 | Fill #0

## 2020-01-21 MED FILL — FINASTERIDE 1 MG TABS: 1 | 90 days supply | Qty: 90 | Fill #1

## 2020-01-21 MED FILL — ARMOUR THYROID 15 MG TABLET: 15 | 30 days supply | Qty: 30 | Fill #3

## 2020-02-19 MED FILL — ARMOUR THYROID 15 MG TABLET: 15 | 30 days supply | Qty: 30 | Fill #4

## 2020-02-19 MED FILL — HYDROCODON-APAP 5-325: 5-325 | 1 days supply | Qty: 8 | Fill #0

## 2020-02-21 MED FILL — clonazePAM 0.5 MG TABS: 0.5 | 15 days supply | Qty: 30 | Fill #2

## 2020-03-12 MED FILL — clonazePAM 0.5 MG TABS: 0.5 | 15 days supply | Qty: 30 | Fill #3

## 2020-03-16 MED FILL — ARMOUR THYROID 15 MG TABLET: 15 | 30 days supply | Qty: 30 | Fill #5

## 2020-03-17 ENCOUNTER — Telehealth: Payer: Self-pay | Admitting: Family Medicine

## 2020-03-17 NOTE — Telephone Encounter (Signed)
Last seen in 2019 for skin irritation.  Spoke with pt and she is refusing office visit for referral. States that she called her insurance company and they "only require a referral, not office notes" Pt would like to go to [facility]. States that she already has an appt, but needs a referral in the system to keep the appt.  Pt wants a callback today in regard to her and her husband. Please advise  Copied from [place] [phone]. Topic: General - Inquiry >> Mar 17, 2020 12:02 PM [name] wrote: Reason for CRM: Pt is requesting a referral for dermatologist. Please advise

## 2020-03-17 NOTE — Telephone Encounter (Signed)
Called and spoke to the pt in about the referral process and set up a physical for her husband but Kelse will call back to schedule

## 2020-03-20 MED FILL — ZOLPIDEM TARTRATE 10 MG TAB: 10 | 90 days supply | Qty: 90 | Fill #0

## 2020-03-31 MED FILL — clonazePAM 0.5 MG TABS: 0.5 | 15 days supply | Qty: 30 | Fill #4

## 2020-04-15 MED FILL — ARMOUR THYROID 15 MG TABLET: 15 | 90 days supply | Qty: 90 | Fill #0

## 2020-04-17 MED FILL — clonazePAM 0.5 MG TABS: 0.5 | 15 days supply | Qty: 30 | Fill #5

## 2020-05-04 ENCOUNTER — Other Ambulatory Visit (HOSPITAL_COMMUNITY): Payer: Self-pay | Admitting: Physician Assistant

## 2020-05-04 MED FILL — LIOTHYRONINE SODIUM 5 MCG T: 5 | 30 days supply | Qty: 60 | Fill #0

## 2020-05-06 ENCOUNTER — Other Ambulatory Visit: Payer: Self-pay | Admitting: Internal Medicine

## 2020-05-06 MED FILL — clonazePAM 0.5 MG TABS: 0.5 | 15 days supply | Qty: 30 | Fill #0

## 2020-05-06 NOTE — Telephone Encounter (Signed)
Dr. Annamaria Boots please advise if you are okay refilling med.  Allergies  Allergen Reactions  . Penicillins Anaphylaxis    Has patient had a PCN reaction causing immediate rash, facial/tongue/throat swelling, SOB or lightheadedness with hypotension: Yes Has patient had a PCN reaction causing severe rash involving mucus membranes or skin necrosis: No Has patient had a PCN reaction that required hospitalization No Has patient had a PCN reaction occurring within the last 10 years: Yes If all of the above answers are "NO", then may proceed with Cephalosporin use.      Current Outpatient Medications:  .  amphetamine-dextroamphetamine (ADDERALL) 10 MG tablet, Take 10 mg by mouth daily as needed (to focus). , Disp: , Rfl:  .  Ascorbic Acid (VITAMIN C PO), Take 1 tablet by mouth daily., Disp: , Rfl:  .  b complex vitamins tablet, Take 1 tablet by mouth daily., Disp: , Rfl:  .  CALCIUM PO, Take 2 tablets by mouth 2 (two) times daily. , Disp: , Rfl:  .  Cholecalciferol (VITAMIN D PO), Take 1 tablet by mouth daily. , Disp: , Rfl:  .  clonazePAM (KLONOPIN) 0.5 MG tablet, TAKE 1 TO 2 TABLETS BY MOUTH DAILY AS NEEDED FOR SLEEP, Disp: 30 tablet, Rfl: 5 .  finasteride (PROPECIA) 1 MG tablet, Take 1 mg by mouth 2 (two) times daily. , Disp: , Rfl:  .  ibuprofen (ADVIL,MOTRIN) 200 MG tablet, Take 600 mg by mouth every 6 (six) hours as needed for moderate pain., Disp: , Rfl:  .  Multiple Vitamin (MULITIVITAMIN WITH MINERALS) TABS, Take 1 tablet by mouth daily., Disp: , Rfl:  .  PRESCRIPTION MEDICATION, See admin instructions. Inject one testosterone tablet intramuscularly into hip once every 5-6 months - administered by [facility] MD, Disp: , Rfl:  .  rizatriptan (MAXALT) 10 MG tablet, Take 10 mg by mouth daily as needed for migraine., Disp: , Rfl: 3 .  thyroid (ARMOUR) 15 MG tablet, Take 15 mg by mouth daily before breakfast., Disp: , Rfl:  .  zolpidem (AMBIEN) 10 MG tablet, Take 10 mg by mouth at bedtime., Disp: ,  Rfl: 1

## 2020-05-06 NOTE — Telephone Encounter (Signed)
Clonazepam refilled 

## 2020-05-20 MED FILL — AMPHETAMINE SALTS 10 MG: 10 | 30 days supply | Qty: 30 | Fill #0

## 2020-05-25 MED FILL — clonazePAM 0.5 MG TABS: 0.5 | 15 days supply | Qty: 30 | Fill #1

## 2020-06-09 ENCOUNTER — Other Ambulatory Visit (HOSPITAL_COMMUNITY): Payer: Self-pay | Admitting: Obstetrics and Gynecology

## 2020-06-09 MED FILL — RIZATRIPTAN BENZOATE 10 MG: 10 | 30 days supply | Qty: 18 | Fill #0

## 2020-06-11 MED FILL — clonazePAM 0.5 MG TABS: 0.5 | 15 days supply | Qty: 30 | Fill #2

## 2020-06-16 ENCOUNTER — Other Ambulatory Visit (HOSPITAL_COMMUNITY): Payer: Self-pay | Admitting: Plastic Surgery

## 2020-06-16 MED FILL — ZOLPIDEM TARTRATE 10 MG TAB: 10 | 90 days supply | Qty: 90 | Fill #0

## 2020-06-16 MED FILL — VALACYCLOVIR HCL 500 MG TAB: 500 | 5 days supply | Qty: 10 | Fill #0

## 2020-06-18 ENCOUNTER — Other Ambulatory Visit (HOSPITAL_COMMUNITY): Payer: Self-pay | Admitting: Obstetrics and Gynecology

## 2020-06-19 MED FILL — LIOTHYRONINE SODIUM 5 MCG T: 5 | 30 days supply | Qty: 60 | Fill #1

## 2020-06-26 ENCOUNTER — Encounter: Payer: 59 | Admitting: Family Medicine

## 2020-06-28 MED FILL — clonazePAM 0.5 MG TABS: 0.5 | 15 days supply | Qty: 30 | Fill #3

## 2020-06-29 ENCOUNTER — Encounter: Payer: Self-pay | Admitting: Family Medicine

## 2020-06-29 ENCOUNTER — Ambulatory Visit (INDEPENDENT_AMBULATORY_CARE_PROVIDER_SITE_OTHER): Payer: No Typology Code available for payment source | Admitting: Family Medicine

## 2020-06-29 ENCOUNTER — Other Ambulatory Visit: Payer: Self-pay

## 2020-06-29 VITALS — BP 124/73 | HR 67 | Temp 99.2°F | Ht 63.0 in | Wt 139.0 lb

## 2020-06-29 DIAGNOSIS — Z0000 Encounter for general adult medical examination without abnormal findings: Secondary | ICD-10-CM

## 2020-06-29 DIAGNOSIS — Z13220 Encounter for screening for lipoid disorders: Secondary | ICD-10-CM

## 2020-06-29 DIAGNOSIS — Z1159 Encounter for screening for other viral diseases: Secondary | ICD-10-CM

## 2020-06-29 DIAGNOSIS — Z0001 Encounter for general adult medical examination with abnormal findings: Secondary | ICD-10-CM | POA: Diagnosis not present

## 2020-06-29 DIAGNOSIS — L600 Ingrowing nail: Secondary | ICD-10-CM

## 2020-06-29 DIAGNOSIS — Z1283 Encounter for screening for malignant neoplasm of skin: Secondary | ICD-10-CM

## 2020-06-29 DIAGNOSIS — Z23 Encounter for immunization: Secondary | ICD-10-CM | POA: Diagnosis not present

## 2020-06-29 DIAGNOSIS — Z131 Encounter for screening for diabetes mellitus: Secondary | ICD-10-CM | POA: Diagnosis not present

## 2020-06-29 NOTE — Progress Notes (Signed)
Subjective:  Patient ID: Jodi Biles., female    DOB: Jun 11, 1961  Age: 59 y.o. MRN: [id number]  CC:  Chief Complaint  Patient presents with   Annual Exam    Pt reports she feels well with no complaints.    HPI Jodi Muro V. presents for   Annual physical exam.  Last visit with me in 2019. Cardiology, Dr. Audie Box, eval in March for orthostatic hypotension.  No further work-up needed at that time. OB/GYN, Dr. Helane Rima. Dermatology, Dr. Pearline Cables, telpogen effluvium in 2019, would like to see [facility] Dimmer for skin CA screening, history of ingrown nail on R great toe - ok now. Needs ongoing podiatry eval.   New grandbaby May 21st - 1st grandchilden.  Son in [place] currently.   Insomnia Chronic insomnia with previous shiftwork sleep disorder complicated by history of breast cancer, evaluated by Dr. Annamaria Boots with [facility] in 2019.  Treated with Adderall 20 mg as needed, Ambien and  Klonopin.  1pm - 9:30 pm shift 5 days per week.  1-2 Klonopin in evening along with 51m ambien.  occasional adderall for menopausal brain fog- Dr GHelane Rima meds Rx by Dr. GHelane Rimaand YAnnamaria Boots  Controlled substance database (PDMP) reviewed.  Migraine headache: Previously treated with Maxalt as needed.  Only as needed - sometimes months without HA, then for few days.   Hypothyroidism: treated with armour thyroid, an cytomel - [facility].  [facility]   Lab Results  Component Value Date   TSH 1.920 05/01/2018   Cancer screening: Colon: colonoscopy at age [age]History of breast cancer, 2001. . S/p bilateral mastectomy. No follow up mammogram recommended. Has volunteered for breast MRI without concerns.  Cervical cancer screening, Pap testing through OB/GYN. Yearly. 08/12/19.   No prior hep c screen.   Immunization History  Administered Date(s) Administered   Influenza Whole 05/15/2017    Influenza-Unspecified 06/05/2016, 06/02/2020   PFIZER SARS-COV-2 Vaccination 08/12/2019, 08/30/2019, 06/19/2020   Tdap 04/03/2015   Zoster Recombinat (Shingrix) 06/29/2020  Tetanus: had 04/03/2015 COVID-19: August 12, 2019, January 8th, booster October 29th.  Shingles vaccine: today.   Depression screen [facility] [place] 06/29/2020 05/01/2018 04/05/2018 11/02/2016 11/02/2016  Decreased Interest 0 0 0 0 0  Down, Depressed, Hopeless 0 0 0 0 0  PHQ - 2 Score 0 0 0 0 0     Hearing Screening   _0  _1  _2  _3  _4  _5  _6  _7  _8   Right ear:           Left ear:             Visual Acuity Screening   Right eye Left eye Both eyes  Without correction:     With correction: _9  has appt pending with optho - has some blurriness after face lift and recent laser treatment around eyes and ointment.   Dental: Every 6 months.   Exercise: Cut back on processed sugar and lost 20#.  Sometimes riding bike at park or at beach. Elliptical/climber every morning - 1 mile. Hand weights.    Wt Readings from Last 3 Encounters:  06/29/20 139 lb (63 kg)  11/14/19 138 lb (62.6 kg)  07/13/18 148 lb (67.1 kg)      History Patient Active Problem List   Diagnosis Date Noted   Migraines 06/26/2017   Cellulitis of left arm 06/25/2017   Insomnia 01/29/2017   Past Medical History:  Diagnosis Date  Allergy    Breast cancer ([place]) 2006   ER-/PR-/her2+   Menopausal and perimenopausal disorder    Migraines    Past Surgical History:  Procedure Laterality Date   BREAST SURGERY     CESAREAN SECTION     lymph node resection     MASTECTOMY     Allergies  Allergen Reactions   Penicillins Anaphylaxis    Has patient had a PCN reaction causing immediate rash, facial/tongue/throat swelling, SOB or lightheadedness with hypotension: Yes Has patient had a PCN reaction causing severe rash involving mucus membranes or skin necrosis: No Has patient had a PCN reaction  that required hospitalization No Has patient had a PCN reaction occurring within the last 10 years: Yes If all of the above answers are "NO", then may proceed with Cephalosporin use.    Prior to Admission medications   Medication Sig Start Date End Date Taking? Authorizing Provider  amphetamine-dextroamphetamine (ADDERALL) 10 MG tablet Take 10 mg by mouth daily as needed (to focus).  08/12/19  Yes [provider]  Ascorbic Acid (VITAMIN C PO) Take 1 tablet by mouth daily.   Yes [provider]  b complex vitamins tablet Take 1 tablet by mouth daily.   Yes [provider]  CALCIUM PO Take 2 tablets by mouth 2 (two) times daily.    Yes [provider]  Cholecalciferol (VITAMIN D PO) Take 1 tablet by mouth daily.    Yes [provider]  clonazePAM (KLONOPIN) 0.5 MG tablet TAKE 1 TO 2 TABLETS BY MOUTH DAILY AS NEEDED FOR SLEEP 05/06/20  Yes Young, Clinton D, MD  ibuprofen (ADVIL,MOTRIN) 200 MG tablet Take 600 mg by mouth every 6 (six) hours as needed for moderate pain.   Yes [provider]  liothyronine (CYTOMEL) 5 MCG tablet TAKE 2 TABLETS BY MOUTH ONCE DAILY ON AN EMPTY STOMACH 06/19/20  Yes [provider]  Multiple Vitamin (MULITIVITAMIN WITH MINERALS) TABS Take 1 tablet by mouth daily.   Yes [provider]  PRESCRIPTION MEDICATION See admin instructions. Inject one testosterone tablet intramuscularly into hip once every 5-6 months - administered by Lyn Henri MD   Yes [provider]  rizatriptan (MAXALT) 10 MG tablet Take 10 mg by mouth daily as needed for migraine. 03/07/16  Yes [provider]  thyroid (ARMOUR) 15 MG tablet Take 15 mg by mouth daily before breakfast.   Yes [provider]  zolpidem (AMBIEN) 10 MG tablet Take 10 mg by mouth at bedtime. 03/07/16  Yes [provider]  finasteride (PROPECIA) 1 MG tablet Take 1 mg by mouth 2 (two) times daily.  09/09/19   [provider]   Social History   Socioeconomic History   Marital status: Married    Spouse name: Not on file   Number of children: Not on file   Years of education: Not on file   Highest education level: Not on file  Occupational History   Not on file  Tobacco Use   Smoking status: Former Smoker    Packs/day: 0.20    Years: 10.00    Pack years: 2.00    Types: Cigarettes    Quit date: 01/25/1997    Years since quitting: 23.4   Smokeless tobacco: Never Used  Substance and Sexual Activity   Alcohol use: Yes    Comment: occasional   Drug use: No   Sexual activity: Not on file  Other Topics Concern   Not on file  Social History Narrative  Not on file   Social Determinants of Health   Financial Resource Strain:    Difficulty of Paying Living Expenses: Not on file  Food Insecurity:    Worried About [place] in the Last Year: Not on file   Ran Out of Food in the Last Year: Not on file  Transportation Needs:    Lack of Transportation (Medical): Not on file   Lack of Transportation (Non-Medical): Not on file  Physical Activity:    Days of Exercise per Week: Not on file   Minutes of Exercise per Session: Not on file  Stress:    Feeling of Stress : Not on file  Social Connections:    Frequency of Communication with Friends and Family: Not on file   Frequency of Social Gatherings with Friends and Family: Not on file   Attends Religious Services: Not on file   Active Member of Clubs or Organizations: Not on file   Attends Archivist Meetings: Not on file   Marital Status: Not on file  Intimate Partner Violence:    Fear of Current or Ex-Partner: Not on file   Emotionally Abused: Not on file   Physically Abused: Not on file   Sexually Abused: Not on file    Review of Systems 13 point review of systems per patient health survey noted.  Negative other than as indicated above or in HPI.    Objective:   Vitals:   06/29/20 0904   BP: 124/73  Pulse: 67  Temp: 99.2 F (37.3 C)  TempSrc: Temporal  SpO2: 99%  Weight: 139 lb (63 kg)  Height: _0  (1.6 m)     Physical Exam Constitutional:      Appearance: She is well-developed.  HENT:     Head: Normocephalic and atraumatic.     Right Ear: External ear normal.     Left Ear: External ear normal.  Eyes:     Conjunctiva/sclera: Conjunctivae normal.     Pupils: Pupils are equal, round, and reactive to light.  Neck:     Thyroid: No thyromegaly.  Cardiovascular:     Rate and Rhythm: Normal rate and regular rhythm.     Heart sounds: Normal heart sounds. No murmur heard.   Pulmonary:     Effort: Pulmonary effort is normal. No respiratory distress.     Breath sounds: Normal breath sounds. No wheezing.  Abdominal:     General: Bowel sounds are normal.     Palpations: Abdomen is soft.     Tenderness: There is no abdominal tenderness.  Musculoskeletal:        General: No tenderness. Normal range of motion.     Cervical back: Normal range of motion and neck supple.  Lymphadenopathy:     Cervical: No cervical adenopathy.  Skin:    General: Skin is warm and dry.     Findings: No rash.  Neurological:     Mental Status: She is alert and oriented to person, place, and time.  Psychiatric:        Behavior: Behavior normal.        Thought Content: Thought content normal.        Assessment & Plan:  Jodi Camberos. is a 59 y.o. female . Annual physical exam  --anticipatory guidance as below in AVS, screening labs above. Health maintenance items as above in HPI discussed/recommended as applicable.   Need for shingles vaccine - Plan: Varicella-zoster vaccine IM  Screening for diabetes mellitus - Plan:  Comprehensive metabolic panel  Screening for hyperlipidemia - Plan: Lipid panel  Need for hepatitis C screening test - Plan: Hepatitis C antibody  Skin cancer screening - Plan: Ambulatory referral to Dermatology  -routine screening.  Ingrowing toenail of  right foot - Plan: Ambulatory referral to Podiatry  -asymptomatic at present - recurrent prior and needs referral for podiatry for ongoing care.   No orders of the defined types were placed in this encounter.  Patient Instructions    Shingles vaccine today, then 2nd dose in 2-6 months.   Keep follow up with [facility] regarding hormone replacement.   Keep follow up with your specialists. Will arrange derm and podiatry eval.   Thanks for coming in today.   Keeping You Healthy  Get These Tests  Blood Pressure- Have your blood pressure checked by your healthcare provider at least once a year.  Normal blood pressure is 120/80.  Weight- Have your body mass index (BMI) calculated to screen for obesity.  BMI is a measure of body fat based on height and weight.  You can calculate your own BMI at [employer].it  Cholesterol- Have your cholesterol checked every year.  Diabetes- Have your blood sugar checked every year if you have high blood pressure, high cholesterol, a family history of diabetes or if you are overweight.  Pap Test - Have a pap test every 1 to 5 years if you have been sexually active.  If you are older than 65 and recent pap tests have been normal you may not need additional pap tests.  In addition, if you have had a hysterectomy  for benign disease additional pap tests are not necessary.  Mammogram-Yearly mammograms are essential for early detection of breast cancer  Screening for Colon Cancer- Colonoscopy starting at age 38. Screening may begin sooner depending on your family history and other health conditions.  Follow up colonoscopy as directed by your Gastroenterologist.  Screening for Osteoporosis- Screening begins at age 11 with bone density scanning, sooner if you are at higher risk for developing Osteoporosis.  Get these medicines  Calcium with Vitamin D- Your body requires 1200-1500 mg of Calcium a day and [phone] IU of Vitamin D a day.  You can only  absorb 500 mg of Calcium at a time therefore Calcium must be taken in 2 or 3 separate doses throughout the day.  Hormones- Hormone therapy has been associated with increased risk for certain cancers and heart disease.  Talk to your healthcare provider about if you need relief from menopausal symptoms.  Get these Immuniztions  Flu shot- Every fall  Pneumonia shot- Once after the age of 51; if you are younger ask your healthcare provider if you need a pneumonia shot.  Tetanus- Every ten years.  Zostavax- Once after the age of 67 to prevent shingles.  Take these steps  Don't smoke- Your healthcare provider can help you quit. For tips on how to quit, ask your healthcare provider or go to www.smokefree.gov or call 1-800 QUIT-NOW.  Be physically active- Exercise 5 days a week for a minimum of 30 minutes.  If you are not already physically active, start slow and gradually work up to 30 minutes of moderate physical activity.  Try walking, dancing, bike riding, swimming, etc.  Eat a healthy diet- Eat a variety of healthy foods such as fruits, vegetables, whole grains, low fat milk, low fat cheeses, yogurt, lean meats, chicken, fish, eggs, dried beans, tofu, etc.  For more information go to www.thenutritionsource.org  Dental  visit- Brush and floss teeth twice daily; visit your dentist twice a year.  Eye exam- Visit your Optometrist or Ophthalmologist yearly.  Drink alcohol in moderation- Limit alcohol intake to one drink or less a day.  Never drink and drive.  Depression- Your emotional health is as important as your physical health.  If you're feeling down or losing interest in things you normally enjoy, please talk to your healthcare provider.  Seat Belts- can save your life; always wear one  Smoke/Carbon Monoxide detectors- These detectors need to be installed on the appropriate level of your home.  Replace batteries at least once a year.  Violence- If anyone is threatening or hurting you,  please tell your healthcare provider.  Living Will/ Health care power of attorney- Discuss with your healthcare provider and family.  If you have lab work done today you will be contacted with your lab results within the next 2 weeks.  If you have not heard from [place] then please contact us. The fastest way to get your results is to register for My Chart.   IF you received an x-ray today, you will receive an invoice from [facility]. Please contact [facility] at [phone] with questions or concerns regarding your invoice.   IF you received labwork today, you will receive an invoice from [facility]. Please contact [facility] at [phone] with questions or concerns regarding your invoice.   Our billing staff will not be able to assist you with questions regarding bills from these companies.  You will be contacted with the lab results as soon as they are available. The fastest way to get your results is to activate your My Chart account. Instructions are located on the last page of this paperwork. If you have not heard from [place] regarding the results in 2 weeks, please contact this office.         Signed, Merri Ray, MD [facility]

## 2020-06-29 NOTE — Patient Instructions (Addendum)
Shingles vaccine today, then 2nd dose in 2-6 months.   Keep follow up with [facility] regarding hormone replacement.   Keep follow up with your specialists. Will arrange derm and podiatry eval.   Thanks for coming in today.   Keeping You Healthy  Get These Tests  Blood Pressure- Have your blood pressure checked by your healthcare provider at least once a year.  Normal blood pressure is 120/80.  Weight- Have your body mass index (BMI) calculated to screen for obesity.  BMI is a measure of body fat based on height and weight.  You can calculate your own BMI at [employer]  Cholesterol- Have your cholesterol checked every year.  Diabetes- Have your blood sugar checked every year if you have high blood pressure, high cholesterol, a family history of diabetes or if you are overweight.  Pap Test - Have a pap test every 1 to 5 years if you have been sexually active.  If you are older than 65 and recent pap tests have been normal you may not need additional pap tests.  In addition, if you have had a hysterectomy  for benign disease additional pap tests are not necessary.  Mammogram-Yearly mammograms are essential for early detection of breast cancer  Screening for Colon Cancer- Colonoscopy starting at age 32. Screening may begin sooner depending on your family history and other health conditions.  Follow up colonoscopy as directed by your Gastroenterologist.  Screening for Osteoporosis- Screening begins at age 14 with bone density scanning, sooner if you are at higher risk for developing Osteoporosis.  Get these medicines  Calcium with Vitamin D- Your body requires 1200-1500 mg of Calcium a day and [phone] IU of Vitamin D a day.  You can only absorb 500 mg of Calcium at a time therefore Calcium must be taken in 2 or 3 separate doses throughout the day.  Hormones- Hormone therapy has been associated with increased risk for certain cancers and heart disease.  Talk to your  healthcare provider about if you need relief from menopausal symptoms.  Get these Immuniztions  Flu shot- Every fall  Pneumonia shot- Once after the age of 43; if you are younger ask your healthcare provider if you need a pneumonia shot.  Tetanus- Every ten years.  Zostavax- Once after the age of 8 to prevent shingles.  Take these steps  Don't smoke- Your healthcare provider can help you quit. For tips on how to quit, ask your healthcare provider or go to www.smokefree.gov or call 1-800 QUIT-NOW.  Be physically active- Exercise 5 days a week for a minimum of 30 minutes.  If you are not already physically active, start slow and gradually work up to 30 minutes of moderate physical activity.  Try walking, dancing, bike riding, swimming, etc.  Eat a healthy diet- Eat a variety of healthy foods such as fruits, vegetables, whole grains, low fat milk, low fat cheeses, yogurt, lean meats, chicken, fish, eggs, dried beans, tofu, etc.  For more information go to www.thenutritionsource.org  Dental visit- Brush and floss teeth twice daily; visit your dentist twice a year.  Eye exam- Visit your Optometrist or Ophthalmologist yearly.  Drink alcohol in moderation- Limit alcohol intake to one drink or less a day.  Never drink and drive.  Depression- Your emotional health is as important as your physical health.  If you're feeling down or losing interest in things you normally enjoy, please talk to your healthcare provider.  Seat Belts- can save your life; always wear one  Smoke/Carbon Monoxide detectors- These detectors need to be installed on the appropriate level of your home.  Replace batteries at least once a year.  Violence- If anyone is threatening or hurting you, please tell your healthcare provider.  Living Will/ Health care power of attorney- Discuss with your healthcare provider and family.  If you have lab work done today you will be contacted with your lab results within the next 2  weeks.  If you have not heard from [place] then please contact us. The fastest way to get your results is to register for My Chart.   IF you received an x-ray today, you will receive an invoice from [facility]. Please contact [facility] at [phone] with questions or concerns regarding your invoice.   IF you received labwork today, you will receive an invoice from [facility]. Please contact [facility] at [phone] with questions or concerns regarding your invoice.   Our billing staff will not be able to assist you with questions regarding bills from these companies.  You will be contacted with the lab results as soon as they are available. The fastest way to get your results is to activate your My Chart account. Instructions are located on the last page of this paperwork. If you have not heard from [place] regarding the results in 2 weeks, please contact this office.

## 2020-06-30 ENCOUNTER — Encounter: Payer: Self-pay | Admitting: Family Medicine

## 2020-06-30 LAB — COMPREHENSIVE METABOLIC PANEL
ALT: 33 IU/L — ABNORMAL HIGH (ref 0–32)
AST: 37 IU/L (ref 0–40)
Albumin/Globulin Ratio: 2.4 — ABNORMAL HIGH (ref 1.2–2.2)
Albumin: 4.7 g/dL (ref 3.8–4.9)
Alkaline Phosphatase: 92 IU/L (ref 44–121)
BUN/Creatinine Ratio: 20 (ref 9–23)
BUN: 16 mg/dL (ref 6–24)
Bilirubin Total: 0.3 mg/dL (ref 0.0–1.2)
CO2: 23 mmol/L (ref 20–29)
Calcium: 9.9 mg/dL (ref 8.7–10.2)
Chloride: 105 mmol/L (ref 96–106)
Creatinine, Ser: 0.82 mg/dL (ref 0.57–1.00)
GFR calc Af Amer: 92 mL/min/{1.73_m2} (ref >59)
GFR calc non Af Amer: 80 mL/min/{1.73_m2} (ref >59)
Globulin, Total: 2 g/dL (ref 1.5–4.5)
Glucose: 100 mg/dL — ABNORMAL HIGH (ref 65–99)
Potassium: 4.8 mmol/L (ref 3.5–5.2)
Sodium: 142 mmol/L (ref 134–144)
Total Protein: 6.7 g/dL (ref 6.0–8.5)

## 2020-06-30 LAB — LIPID PANEL
Chol/HDL Ratio: 3.1 ratio (ref 0.0–4.4)
Cholesterol, Total: 201 mg/dL — ABNORMAL HIGH (ref 100–199)
HDL: 65 mg/dL (ref >39)
LDL Chol Calc (NIH): 123 mg/dL — ABNORMAL HIGH (ref 0–99)
Triglycerides: 69 mg/dL (ref 0–149)
VLDL Cholesterol Cal: 13 mg/dL (ref 5–40)

## 2020-06-30 LAB — HEPATITIS C ANTIBODY: Hep C Virus Ab: <0.1 s/co ratio (ref 0.0–0.9)

## 2020-07-03 ENCOUNTER — Encounter: Payer: Self-pay | Admitting: Family Medicine

## 2020-07-08 ENCOUNTER — Ambulatory Visit: Payer: Self-pay

## 2020-07-08 ENCOUNTER — Other Ambulatory Visit (HOSPITAL_COMMUNITY): Payer: Self-pay | Admitting: Obstetrics and Gynecology

## 2020-07-08 MED FILL — AMPHETAMINE SALTS 10 MG: 10 | 30 days supply | Qty: 30 | Fill #0

## 2020-07-09 ENCOUNTER — Other Ambulatory Visit: Payer: Self-pay | Admitting: Podiatry

## 2020-07-09 ENCOUNTER — Other Ambulatory Visit: Payer: Self-pay

## 2020-07-09 ENCOUNTER — Ambulatory Visit (INDEPENDENT_AMBULATORY_CARE_PROVIDER_SITE_OTHER): Payer: No Typology Code available for payment source | Admitting: Podiatry

## 2020-07-09 VITALS — BP 115/75 | HR 73 | Temp 98.1°F

## 2020-07-09 DIAGNOSIS — L600 Ingrowing nail: Secondary | ICD-10-CM | POA: Diagnosis not present

## 2020-07-09 MED ORDER — NEOMYCIN-POLYMYXIN-HC 3.5-10000-1 OT SOLN: OTIC | 1 refills | Status: DC

## 2020-07-09 MED FILL — NEO/POLYMYXIN/HC EAR SOLN: 3.5-10000-1 | 25 days supply | Qty: 10 | Fill #0

## 2020-07-09 NOTE — Progress Notes (Signed)
Subjective:   Patient ID: Jodi Nunez., female   DOB: 59 y.o.   MRN: [id number]   HPI Patient states she has chronic pain of the big toenails of both feet and has tried previous trimming soaks and pedicure.  Patient states they are gradually becoming more of an issue for her and patient does not smoke likes to be active   Review of Systems  All other systems reviewed and are negative.       Objective:  Physical Exam Vitals and nursing note reviewed.  Constitutional:      Appearance: She is well-developed.  Pulmonary:     Effort: Pulmonary effort is normal.  Musculoskeletal:        General: Normal range of motion.  Skin:    General: Skin is warm.  Neurological:     Mental Status: She is alert.     Neurovascular status intact muscle strength found to be adequate with patient noted to have incurvated hallux nails medial border bilateral that are painful.  There is no active drainage noted slight redness no proximal edema or edema drainage noted with deformity of the nailbed noted.  Patient is found to have good digital perfusion well oriented x3     Assessment:  Chronic ingrown toenail deformity hallux bilateral medial border with pain     Plan:  H&P reviewed condition discussed treatment options.  She wants permanent correction and at this point I did allow her to read consent form going over all her new treatments and complications with surgery.  She is willing to accept risk and signed consent form and today I infiltrated each hallux 60 mg like Marcaine mixture sterile prep applied and using sterile instrumentation remove the medial border bilateral exposed matrix applied phenol 3 applications 30 seconds followed by alcohol lavage and sterile dressing and gave instructions on soaks.  I then advised on leaving dressings on 24 hours but taking them off earlier if any throbbing were to occur and encouraged her to call with questions concerns which may arise

## 2020-07-09 NOTE — Patient Instructions (Signed)
Place 1/4 cup of epsom salts in a quart of warm tap water.  Submerge your foot or feet in the solution and soak for 20 minutes.  This soak should be done twice a day.  Next, remove your foot or feet from solution, blot dry the affected area. Apply ointment and cover if instructed by your doctor.   IF YOUR SKIN BECOMES IRRITATED WHILE USING THESE INSTRUCTIONS, IT IS OKAY TO SWITCH TO  WHITE VINEGAR AND WATER.  As another alternative soak, you may use antibacterial soap and water.  Monitor for any signs/symptoms of infection. Call the office immediately if any occur or go directly to the emergency room. Call with any questions/concerns.  Ingrown Toenail An ingrown toenail occurs when the corner or sides of a toenail grow into the surrounding skin. This causes discomfort and pain. The big toe is most commonly affected, but any of the toes can be affected. If an ingrown toenail is not treated, it can become infected. What are the causes? This condition may be caused by:  Wearing shoes that are too small or tight.  An injury, such as stubbing your toe or having your toe stepped on.  Improper cutting or care of your toenails.  Having nail or foot abnormalities that were present from birth (congenital abnormalities), such as having a nail that is too big for your toe. What increases the risk? The following factors may make you more likely to develop ingrown toenails:  Age. Nails tend to get thicker with age, so ingrown nails are more common among older people.  Cutting your toenails incorrectly, such as cutting them very short or cutting them unevenly. An ingrown toenail is more likely to get infected if you have:  Diabetes.  Blood flow (circulation) problems. What are the signs or symptoms? Symptoms of an ingrown toenail may include:  Pain, soreness, or tenderness.  Redness.  Swelling.  Hardening of the skin that surrounds the toenail. Signs that an ingrown toenail may be infected  include:  Fluid or pus.  Symptoms that get worse instead of better. How is this diagnosed? An ingrown toenail may be diagnosed based on your medical history, your symptoms, and a physical exam. If you have fluid or blood coming from your toenail, a sample may be collected to test for the specific type of bacteria that is causing the infection. How is this treated? Treatment depends on how severe your ingrown toenail is. You may be able to care for your toenail at home.  If you have an infection, you may be prescribed antibiotic medicines.  If you have fluid or pus draining from your toenail, your health care provider may drain it.  If you have trouble walking, you may be given crutches to use.  If you have a severe or infected ingrown toenail, you may need a procedure to remove part or all of the nail. Follow these instructions at home: Foot care   Do not pick at your toenail or try to remove it yourself.  Soak your foot in warm, soapy water. Do this for 20 minutes, 3 times a day, or as often as told by your health care provider. This helps to keep your toe clean and keep your skin soft.  Wear shoes that fit well and are not too tight. Your health care provider may recommend that you wear open-toed shoes while you heal.  Trim your toenails regularly and carefully. Cut your toenails straight across to prevent injury to the skin at the   corners of the toenail. Do not cut your nails in a curved shape.  Keep your feet clean and dry to help prevent infection. Medicines  Take over-the-counter and prescription medicines only as told by your health care provider.  If you were prescribed an antibiotic, take it as told by your health care provider. Do not stop taking the antibiotic even if you start to feel better. Activity  Return to your normal activities as told by your health care provider. Ask your health care provider what activities are safe for you.  Avoid activities that cause  pain. General instructions  If your health care provider told you to use crutches to help you move around, use them as instructed.  Keep all follow-up visits as told by your health care provider. This is important. Contact a health care provider if:  You have more redness, swelling, pain, or other symptoms that do not improve with treatment.  You have fluid, blood, or pus coming from your toenail. Get help right away if:  You have a red streak on your skin that starts at your foot and spreads up your leg.  You have a fever. Summary  An ingrown toenail occurs when the corner or sides of a toenail grow into the surrounding skin. This causes discomfort and pain. The big toe is most commonly affected, but any of the toes can be affected.  If an ingrown toenail is not treated, it can become infected.  Fluid or pus draining from your toenail is a sign of infection. Your health care provider may need to drain it. You may be given antibiotics to treat the infection.  Trimming your toenails regularly and properly can help you prevent an ingrown toenail. This information is not intended to replace advice given to you by your health care provider. Make sure you discuss any questions you have with your health care provider. Document Revised: 11/30/2018 Document Reviewed: 04/26/2017 Elsevier Patient Education  2020 [employer]  

## 2020-07-14 ENCOUNTER — Telehealth: Payer: Self-pay | Admitting: Family Medicine

## 2020-07-14 NOTE — Telephone Encounter (Signed)
Pt is wanting Dr. Carlota Raspberry to take over her  thyroid medical management and care. She is bringing over her last 2  recent lab results and current medication list concerning this issue.

## 2020-07-15 ENCOUNTER — Other Ambulatory Visit (HOSPITAL_COMMUNITY): Payer: Self-pay | Admitting: Physician Assistant

## 2020-07-15 MED FILL — clonazePAM 0.5 MG TABS: 0.5 | 15 days supply | Qty: 30 | Fill #4

## 2020-07-15 MED FILL — ARMOUR THYROID 15 MG TABLET: 15 | 30 days supply | Qty: 30 | Fill #0

## 2020-07-15 NOTE — Telephone Encounter (Signed)
Patient dropped off documents for her provider that need to be scanned into her chart. I placed the documents in your folder at the nurse's station. Please advise at [phone]

## 2020-07-15 NOTE — Telephone Encounter (Signed)
FYI, I will put these in your review box.

## 2020-07-20 MED FILL — LIOTHYRONINE SODIUM 5 MCG T: 5 | 30 days supply | Qty: 60 | Fill #2

## 2020-07-22 ENCOUNTER — Telehealth: Payer: Self-pay | Admitting: Family Medicine

## 2020-07-22 DIAGNOSIS — R739 Hyperglycemia, unspecified: Secondary | ICD-10-CM

## 2020-07-22 DIAGNOSIS — E039 Hypothyroidism, unspecified: Secondary | ICD-10-CM

## 2020-07-22 NOTE — Telephone Encounter (Signed)
Dr Carlota Raspberry this patient was last seen by you 06/29/20. Patient last labs were done on 06/29/20 I do not see anywhere you needed to repeat curtain labs. I spoke with patient. Patient stated she spoke with you about taking over care for her thyroid due to the out of pocket cost for [facility]. In your encounter you mentioned "treated with armour thyroid, an cytomel - ordered by practitioner with hormone replacement at [facility], low normal. Would like to have checked at [facility].  Not sure what labs you would like to order. Can you please assist me in which labs you would like for [place] to order for her lab appt here on 08/05/20.   Thanks  [employer]

## 2020-07-22 NOTE — Telephone Encounter (Signed)
Reminder to put in orders for labs scheduled for 12/15. Patient has concerns about thyroid meds and hormone replacement.  Patient never received call back to discuss lab results from physical on 11/8. Please advise at [phone]

## 2020-07-28 NOTE — Telephone Encounter (Signed)
LVM for a return call to inform of providers's follow up information regarding thyroid management.

## 2020-07-28 NOTE — Telephone Encounter (Signed)
My understanding at her November visit was that she would be continuing with [facility] for those medications.  I do not mind checking her thyroid hormone level at her appointment December 15th, but with those medications would like her to be followed by either endocrinology or [facility].  Those are not ones I use routinely.  We can discuss this at her follow-up visit as well if needed.   I have pended orders for A1c and TSH to be done at lab visit prior to that visit, but let me know if there are questions.

## 2020-07-29 NOTE — Telephone Encounter (Signed)
Read patient Provider notes / pt understood and will be her on the 15th to have labs drawn / patient stated her insurance doesn't cover her labs through [facility] clinic and it is has been  costing her out of pocket .

## 2020-08-01 MED FILL — clonazePAM 0.5 MG TABS: 0.5 | 15 days supply | Qty: 30 | Fill #5

## 2020-08-05 ENCOUNTER — Other Ambulatory Visit: Payer: Self-pay

## 2020-08-05 ENCOUNTER — Ambulatory Visit: Payer: No Typology Code available for payment source

## 2020-08-05 DIAGNOSIS — E039 Hypothyroidism, unspecified: Secondary | ICD-10-CM

## 2020-08-05 DIAGNOSIS — R739 Hyperglycemia, unspecified: Secondary | ICD-10-CM

## 2020-08-06 LAB — HEMOGLOBIN A1C
Est. average glucose Bld gHb Est-mCnc: 117 mg/dL
Hgb A1c MFr Bld: 5.7 % — ABNORMAL HIGH (ref 4.8–5.6)

## 2020-08-06 LAB — TSH: TSH: 1.6 u[IU]/mL (ref 0.450–4.500)

## 2020-08-12 ENCOUNTER — Ambulatory Visit: Payer: No Typology Code available for payment source | Admitting: Family Medicine

## 2020-08-17 ENCOUNTER — Other Ambulatory Visit: Payer: Self-pay | Admitting: Internal Medicine

## 2020-08-17 MED FILL — clonazePAM 0.5 MG TABS: 0.5 | 15 days supply | Qty: 30 | Fill #0

## 2020-08-17 NOTE — Telephone Encounter (Signed)
Refill sent.

## 2020-08-18 ENCOUNTER — Other Ambulatory Visit (HOSPITAL_COMMUNITY): Payer: Self-pay | Admitting: Physician Assistant

## 2020-08-18 MED FILL — RIZATRIPTAN BENZOATE 10 MG: 10 | 30 days supply | Qty: 18 | Fill #1

## 2020-08-18 MED FILL — ARMOUR THYROID 15 MG TABLET: 15 | 90 days supply | Qty: 90 | Fill #0

## 2020-08-19 ENCOUNTER — Ambulatory Visit: Payer: No Typology Code available for payment source | Admitting: Family Medicine

## 2020-08-20 ENCOUNTER — Ambulatory Visit: Payer: No Typology Code available for payment source | Admitting: Family Medicine

## 2020-08-31 MED FILL — clonazePAM 0.5 MG TABS: 0.5 | 15 days supply | Qty: 30 | Fill #1

## 2020-09-10 MED FILL — LIOTHYRONINE SODIUM 5 MCG T: 5 | 30 days supply | Qty: 60 | Fill #3

## 2020-09-14 ENCOUNTER — Other Ambulatory Visit (HOSPITAL_COMMUNITY): Payer: Self-pay | Admitting: Obstetrics and Gynecology

## 2020-09-14 MED FILL — ZOLPIDEM TARTRATE 10 MG TAB: 10 | 90 days supply | Qty: 90 | Fill #0

## 2020-09-14 MED FILL — clonazePAM 0.5 MG TABS: 0.5 | 15 days supply | Qty: 30 | Fill #2

## 2020-09-24 IMAGING — DX DG CHEST 1V
1 series · 1 of 1 positions shown · non-contrast
Comparison: None.

CLINICAL DATA: Near syncope.

EXAM:
CHEST  1 VIEW

[chest ap]
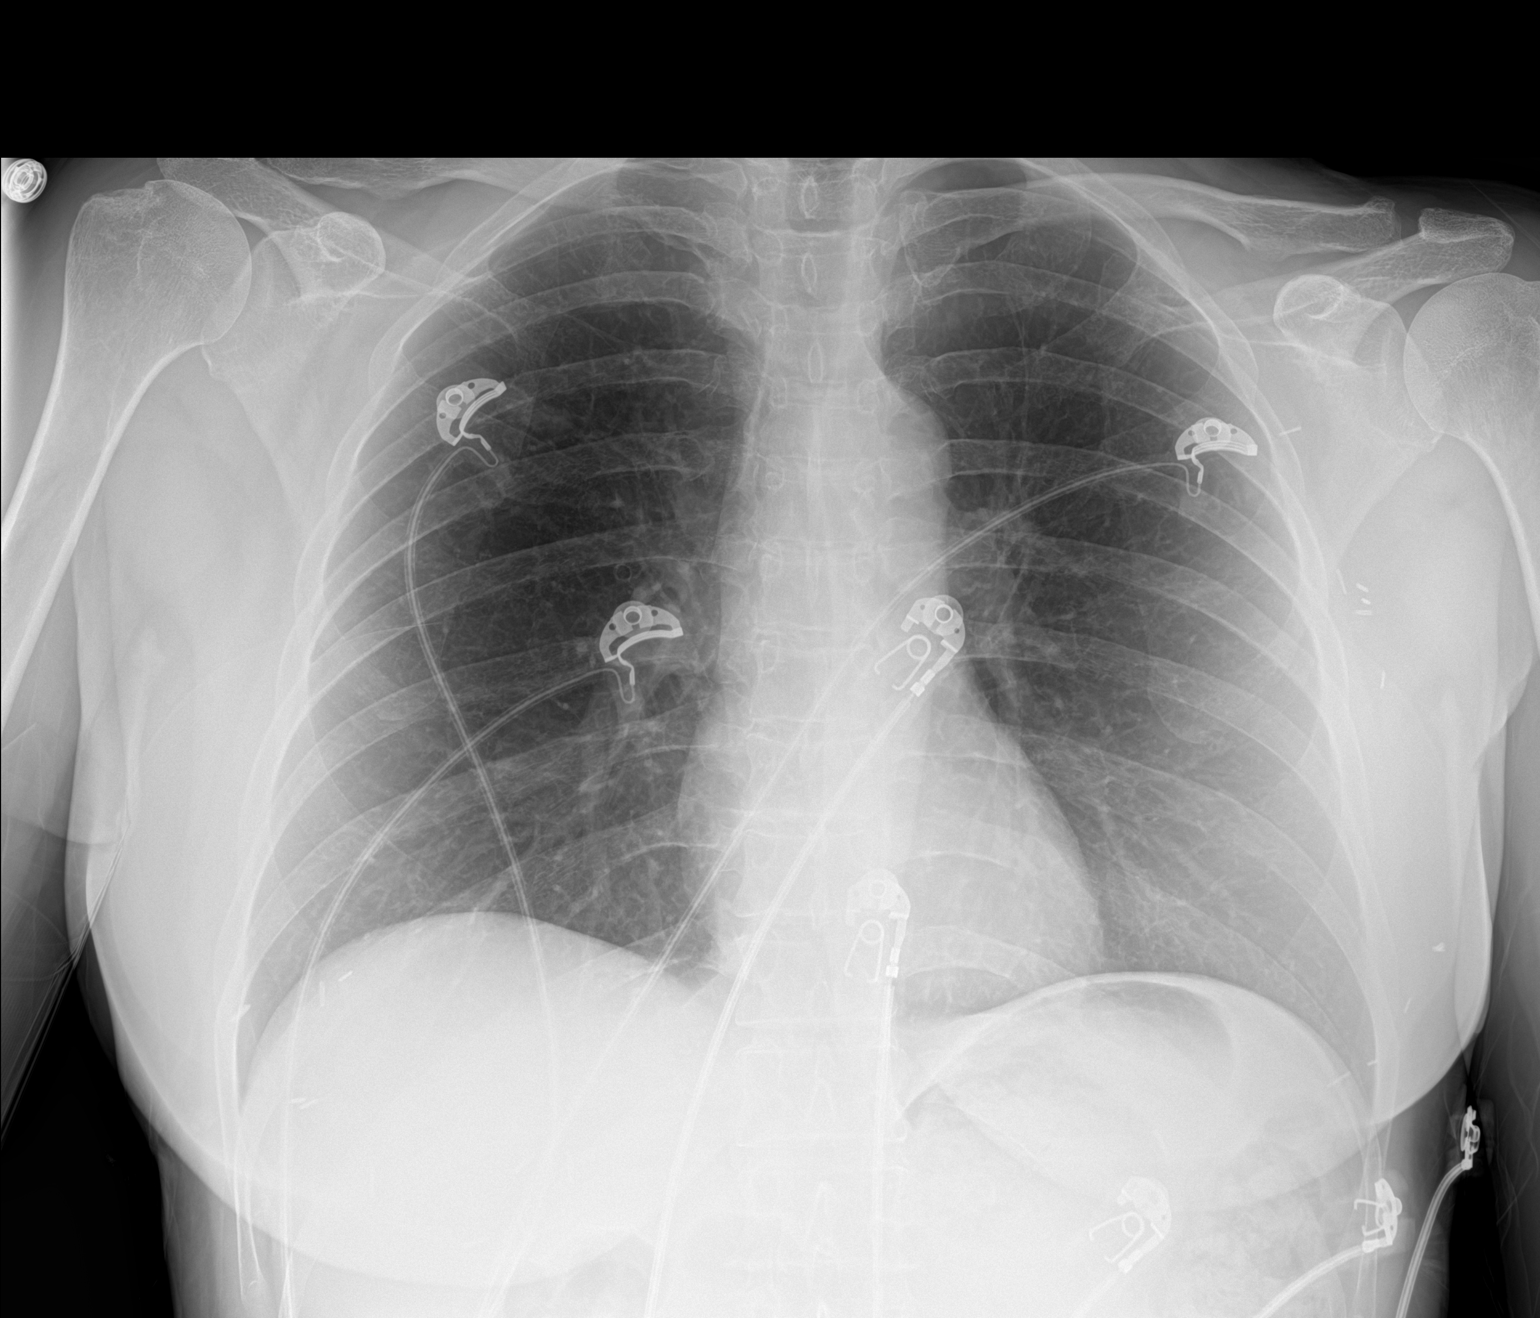

[1 of 1 positions shown; findings below may reference images not displayed]

FINDINGS: The cardiomediastinal contours are normal. The lungs are clear.
Pulmonary vasculature is normal. No consolidation, pleural effusion,
or pneumothorax. No acute osseous abnormalities are seen. Surgical
clips project over both breasts and left axilla.
IMPRESSION: No acute chest findings.

## 2020-09-25 ENCOUNTER — Other Ambulatory Visit (HOSPITAL_COMMUNITY): Payer: Self-pay | Admitting: Obstetrics and Gynecology

## 2020-09-25 MED FILL — DIETHYLPROPION ER 75 MG TAB: 75 | 30 days supply | Qty: 30 | Fill #0

## 2020-09-29 ENCOUNTER — Other Ambulatory Visit (HOSPITAL_COMMUNITY): Payer: Self-pay | Admitting: Obstetrics and Gynecology

## 2020-09-29 MED FILL — NITROFURANTOIN MONO-MCR 100: 100 | 7 days supply | Qty: 14 | Fill #0

## 2020-09-30 MED FILL — clonazePAM 0.5 MG TABS: 0.5 | 15 days supply | Qty: 30 | Fill #3

## 2020-10-12 ENCOUNTER — Other Ambulatory Visit (HOSPITAL_COMMUNITY): Payer: Self-pay | Admitting: Nurse Practitioner

## 2020-10-12 MED FILL — AMPHETAMINE SALTS 10 MG: 10 | 30 days supply | Qty: 30 | Fill #0

## 2020-10-12 MED FILL — LIOTHYRONINE SODIUM 5 MCG T: 5 | 30 days supply | Qty: 60 | Fill #0

## 2020-10-14 MED FILL — clonazePAM 0.5 MG TABS: 0.5 | 15 days supply | Qty: 30 | Fill #4

## 2020-10-15 ENCOUNTER — Other Ambulatory Visit (HOSPITAL_COMMUNITY): Payer: Self-pay | Admitting: Nurse Practitioner

## 2020-10-15 MED FILL — ESTRADIOL 0.1 MG/GM CREA: 0.1 | 14 days supply | Qty: 43 | Fill #0

## 2020-10-26 MED FILL — DIETHYLPROPION ER 75 MG TAB: 75 | 30 days supply | Qty: 30 | Fill #1

## 2020-11-02 MED FILL — clonazePAM 0.5 MG TABS: 0.5 | 15 days supply | Qty: 30 | Fill #5

## 2020-11-17 MED FILL — ARMOUR THYROID 15 MG TABLET: 15 | 30 days supply | Qty: 30 | Fill #1

## 2020-11-20 ENCOUNTER — Other Ambulatory Visit: Payer: Self-pay | Admitting: Internal Medicine

## 2020-11-20 MED FILL — clonazePAM 0.5 MG TABS: 0.5 | 15 days supply | Qty: 30 | Fill #0

## 2020-11-20 NOTE — Telephone Encounter (Signed)
Dr. Annamaria Boots, please advise if you are okay refilling med.  Allergies  Allergen Reactions  . Penicillins Anaphylaxis    Has patient had a PCN reaction causing immediate rash, facial/tongue/throat swelling, SOB or lightheadedness with hypotension: Yes Has patient had a PCN reaction causing severe rash involving mucus membranes or skin necrosis: No Has patient had a PCN reaction that required hospitalization No Has patient had a PCN reaction occurring within the last 10 years: Yes If all of the above answers are "NO", then may proceed with Cephalosporin use.      Current Outpatient Medications:  .  amphetamine-dextroamphetamine (ADDERALL) 10 MG tablet, Take 10 mg by mouth daily as needed (to focus). , Disp: , Rfl:  .  Ascorbic Acid (VITAMIN C PO), Take 1 tablet by mouth daily., Disp: , Rfl:  .  b complex vitamins tablet, Take 1 tablet by mouth daily., Disp: , Rfl:  .  CALCIUM PO, Take 2 tablets by mouth 2 (two) times daily. , Disp: , Rfl:  .  Cholecalciferol (VITAMIN D PO), Take 1 tablet by mouth daily. , Disp: , Rfl:  .  clonazePAM (KLONOPIN) 0.5 MG tablet, TAKE 1 TO 2 TABLETS BY MOUTH DAILY AS NEEDED FOR SLEEP, Disp: 30 tablet, Rfl: 5 .  ibuprofen (ADVIL,MOTRIN) 200 MG tablet, Take 600 mg by mouth every 6 (six) hours as needed for moderate pain., Disp: , Rfl:  .  liothyronine (CYTOMEL) 5 MCG tablet, TAKE 2 TABLETS BY MOUTH ONCE DAILY ON AN EMPTY STOMACH, Disp: , Rfl:  .  Multiple Vitamin (MULITIVITAMIN WITH MINERALS) TABS, Take 1 tablet by mouth daily., Disp: , Rfl:  .  neomycin-polymyxin-hydrocortisone (CORTISPORIN) OTIC solution, Apply 1-2 drops to toe after soaking BID, Disp: 10 mL, Rfl: 1 .  PRESCRIPTION MEDICATION, See admin instructions. Inject one testosterone tablet intramuscularly into hip once every 5-6 months - administered by [facility] MD, Disp: , Rfl:  .  rizatriptan (MAXALT) 10 MG tablet, Take 10 mg by mouth daily as needed for migraine., Disp: , Rfl: 3 .  thyroid (ARMOUR) 15 MG  tablet, Take 15 mg by mouth daily before breakfast., Disp: , Rfl:  .  zolpidem (AMBIEN) 10 MG tablet, Take 10 mg by mouth at bedtime., Disp: , Rfl: 1

## 2020-11-20 NOTE — Telephone Encounter (Signed)
Clonazepam refilled 

## 2020-11-21 ENCOUNTER — Other Ambulatory Visit (HOSPITAL_COMMUNITY): Payer: Self-pay

## 2020-11-29 ENCOUNTER — Other Ambulatory Visit: Payer: Self-pay | Admitting: Obstetrics and Gynecology

## 2020-12-02 ENCOUNTER — Other Ambulatory Visit (HOSPITAL_COMMUNITY): Payer: Self-pay

## 2020-12-06 MED FILL — Clonazepam Tab 0.5 MG: ORAL | 15 days supply | Qty: 30 | Fill #0 | Status: AC

## 2020-12-07 ENCOUNTER — Other Ambulatory Visit (HOSPITAL_COMMUNITY): Payer: Self-pay

## 2020-12-08 ENCOUNTER — Other Ambulatory Visit: Payer: Self-pay | Admitting: Obstetrics and Gynecology

## 2020-12-08 ENCOUNTER — Other Ambulatory Visit (HOSPITAL_COMMUNITY): Payer: Self-pay

## 2020-12-09 ENCOUNTER — Other Ambulatory Visit (HOSPITAL_COMMUNITY): Payer: Self-pay

## 2020-12-09 ENCOUNTER — Other Ambulatory Visit: Payer: Self-pay | Admitting: Obstetrics and Gynecology

## 2020-12-09 MED ORDER — RIZATRIPTAN BENZOATE 10 MG PO TABS
ORAL_TABLET | ORAL | 1 refills | Status: DC
  Filled 2020-12-09: qty 18, 30d supply, fill #0
  Filled 2021-02-08: qty 18, 30d supply, fill #1

## 2020-12-10 ENCOUNTER — Other Ambulatory Visit (HOSPITAL_COMMUNITY): Payer: Self-pay

## 2020-12-10 MED ORDER — ZOLPIDEM TARTRATE 10 MG PO TABS
ORAL_TABLET | Freq: Every day | ORAL | 0 refills | Status: DC
  Filled 2020-12-10: qty 90, 90d supply, fill #0

## 2020-12-14 ENCOUNTER — Other Ambulatory Visit (HOSPITAL_COMMUNITY): Payer: Self-pay

## 2020-12-14 ENCOUNTER — Other Ambulatory Visit: Payer: Self-pay

## 2020-12-17 ENCOUNTER — Other Ambulatory Visit: Payer: Self-pay

## 2020-12-17 ENCOUNTER — Other Ambulatory Visit: Payer: Self-pay | Admitting: Obstetrics and Gynecology

## 2020-12-18 ENCOUNTER — Other Ambulatory Visit (HOSPITAL_COMMUNITY): Payer: Self-pay

## 2020-12-18 ENCOUNTER — Other Ambulatory Visit: Payer: Self-pay | Admitting: Obstetrics and Gynecology

## 2020-12-18 ENCOUNTER — Other Ambulatory Visit: Payer: Self-pay

## 2020-12-18 MED ORDER — LIOTHYRONINE SODIUM 5 MCG PO TABS
10.0000 ug | ORAL_TABLET | Freq: Every day | ORAL | 0 refills | Status: DC
  Filled 2020-12-18: qty 60, 30d supply, fill #0

## 2020-12-18 MED ORDER — ARMOUR THYROID 15 MG PO TABS
15.0000 mg | ORAL_TABLET | Freq: Every day | ORAL | 3 refills | Status: DC
  Filled 2020-12-18: qty 30, 30d supply, fill #0
  Filled 2021-01-15: qty 30, 30d supply, fill #1
  Filled 2021-02-16: qty 30, 30d supply, fill #2

## 2020-12-19 ENCOUNTER — Other Ambulatory Visit (HOSPITAL_COMMUNITY): Payer: Self-pay

## 2020-12-22 ENCOUNTER — Other Ambulatory Visit: Payer: Self-pay | Admitting: Obstetrics and Gynecology

## 2020-12-22 ENCOUNTER — Other Ambulatory Visit (HOSPITAL_COMMUNITY): Payer: Self-pay

## 2020-12-23 ENCOUNTER — Other Ambulatory Visit (HOSPITAL_COMMUNITY): Payer: Self-pay

## 2020-12-23 MED FILL — Clonazepam Tab 0.5 MG: ORAL | 15 days supply | Qty: 30 | Fill #1 | Status: AC

## 2020-12-24 ENCOUNTER — Other Ambulatory Visit (HOSPITAL_COMMUNITY): Payer: Self-pay

## 2020-12-24 MED ORDER — AMPHETAMINE-DEXTROAMPHETAMINE 10 MG PO TABS
ORAL_TABLET | Freq: Every day | ORAL | 0 refills | Status: DC
  Filled 2020-12-24: qty 30, 30d supply, fill #0

## 2020-12-24 MED FILL — Diethylpropion HCl Tab ER 24HR 75 MG: ORAL | 30 days supply | Qty: 30 | Fill #0 | Status: AC

## 2021-01-06 ENCOUNTER — Telehealth: Payer: Self-pay | Admitting: Family Medicine

## 2021-01-06 ENCOUNTER — Other Ambulatory Visit (HOSPITAL_COMMUNITY): Payer: Self-pay

## 2021-01-06 DIAGNOSIS — L659 Nonscarring hair loss, unspecified: Secondary | ICD-10-CM

## 2021-01-06 MED ORDER — SPIRONOLACTONE 100 MG PO TABS
100.0000 mg | ORAL_TABLET | Freq: Every day | ORAL | 11 refills | Status: DC
  Filled 2021-01-06: qty 30, 30d supply, fill #0
  Filled 2021-01-30: qty 30, 30d supply, fill #1
  Filled 2021-03-03: qty 30, 30d supply, fill #2
  Filled 2021-04-05: qty 30, 30d supply, fill #3
  Filled 2021-05-04: qty 30, 30d supply, fill #4
  Filled 2021-06-07: qty 30, 30d supply, fill #5
  Filled 2021-07-07: qty 30, 30d supply, fill #6
  Filled 2021-08-26: qty 30, 30d supply, fill #7
  Filled 2021-10-25: qty 30, 30d supply, fill #8
  Filled 2021-11-29: qty 30, 30d supply, fill #9
  Filled 2021-12-30: qty 30, 30d supply, fill #10

## 2021-01-06 NOTE — Telephone Encounter (Signed)
Pt needs to be seen by dermatology for hair loss.   She was going to [facility] dermatology she saw Dr. Elvera Lennox he told pt that she needs to see a hair specialist the two providers in the area was the ones given below.   Pt reached out to [employer] and their needs to be a Network exp. Form filled out and backed back.   She is having rapid hair loss and scalp irritation.

## 2021-01-06 NOTE — Telephone Encounter (Signed)
Pt called in stating that at the dermatologist she was referred to by Dr. Carlota Raspberry states that she needs to have a referral sent to [facility] and/or [facility]?   Since she has the [employer] plan I did tell pt to check with insurance to see if these providers are in network and to let me know.  Pt will call insurance and call me back.  Please advise if pt needs an appt the last visit was in Nov. 2021

## 2021-01-06 NOTE — Telephone Encounter (Signed)
I have placed the records from the dermatologist in the bin upfront and I have reached out to the insurance company to have them fax over the exp. Form.

## 2021-01-08 ENCOUNTER — Other Ambulatory Visit (HOSPITAL_COMMUNITY): Payer: Self-pay

## 2021-01-08 MED FILL — Clonazepam Tab 0.5 MG: ORAL | 15 days supply | Qty: 30 | Fill #2 | Status: AC

## 2021-01-11 NOTE — Telephone Encounter (Signed)
Pt is calling back checking on this, please advise

## 2021-01-13 NOTE — Telephone Encounter (Signed)
Notes reviewed from May 18 dermatology note.  Recommended referral to either Amy McMichael or Georg Ruddle at [facility].  Order placed.

## 2021-01-13 NOTE — Addendum Note (Signed)
Addended by: Merri Ray R on: 01/13/2021 05:43 PM   Modules accepted: Orders

## 2021-01-14 NOTE — Telephone Encounter (Signed)
Sent Dr. Carlota Raspberry a message

## 2021-01-15 ENCOUNTER — Other Ambulatory Visit (HOSPITAL_COMMUNITY): Payer: Self-pay

## 2021-01-15 ENCOUNTER — Encounter: Payer: Self-pay | Admitting: Family Medicine

## 2021-01-28 MED FILL — Clonazepam Tab 0.5 MG: ORAL | 15 days supply | Qty: 30 | Fill #3 | Status: AC

## 2021-01-29 ENCOUNTER — Other Ambulatory Visit (HOSPITAL_COMMUNITY): Payer: Self-pay

## 2021-01-30 ENCOUNTER — Other Ambulatory Visit (HOSPITAL_COMMUNITY): Payer: Self-pay

## 2021-02-09 ENCOUNTER — Other Ambulatory Visit (HOSPITAL_COMMUNITY): Payer: Self-pay

## 2021-02-16 MED FILL — Clonazepam Tab 0.5 MG: ORAL | 15 days supply | Qty: 30 | Fill #4 | Status: AC

## 2021-02-17 ENCOUNTER — Other Ambulatory Visit (HOSPITAL_COMMUNITY): Payer: Self-pay

## 2021-02-23 ENCOUNTER — Other Ambulatory Visit (HOSPITAL_COMMUNITY): Payer: Self-pay

## 2021-02-25 ENCOUNTER — Telehealth: Payer: Self-pay

## 2021-02-25 DIAGNOSIS — H539 Unspecified visual disturbance: Secondary | ICD-10-CM

## 2021-02-25 DIAGNOSIS — H5213 Myopia, bilateral: Secondary | ICD-10-CM | POA: Diagnosis not present

## 2021-02-25 NOTE — Telephone Encounter (Signed)
Pt needs Emergency referral to [facility] [phone] retina is detaching and she is going there immediately   Pt call back [phone]

## 2021-02-25 NOTE — Telephone Encounter (Signed)
As this was of possible emergent nature, is referral still needed? Let me know and can obtain info form [facility] what is needed. Thanks.

## 2021-03-03 ENCOUNTER — Other Ambulatory Visit (HOSPITAL_COMMUNITY): Payer: Self-pay

## 2021-03-03 ENCOUNTER — Other Ambulatory Visit: Payer: Self-pay | Admitting: Family Medicine

## 2021-03-03 NOTE — Telephone Encounter (Signed)
This medicine is prescribed by Dr. Helane Rima.  Refill declined, advised to contact her office for refill.

## 2021-03-03 NOTE — Telephone Encounter (Signed)
Last filled  12/24/20 #30 with no refills Last visit 06/29/20 Next visit none

## 2021-03-04 ENCOUNTER — Other Ambulatory Visit: Payer: Self-pay | Admitting: Internal Medicine

## 2021-03-04 ENCOUNTER — Other Ambulatory Visit (HOSPITAL_COMMUNITY): Payer: Self-pay

## 2021-03-04 ENCOUNTER — Encounter: Payer: Self-pay | Admitting: Family Medicine

## 2021-03-04 MED ORDER — AMPHETAMINE-DEXTROAMPHETAMINE 10 MG PO TABS
10.0000 mg | ORAL_TABLET | Freq: Every day | ORAL | 0 refills | Status: DC
  Filled 2021-03-04: qty 30, 30d supply, fill #0

## 2021-03-05 ENCOUNTER — Other Ambulatory Visit (HOSPITAL_COMMUNITY): Payer: Self-pay

## 2021-03-05 ENCOUNTER — Ambulatory Visit (INDEPENDENT_AMBULATORY_CARE_PROVIDER_SITE_OTHER): Payer: No Typology Code available for payment source | Admitting: Family Medicine

## 2021-03-05 ENCOUNTER — Other Ambulatory Visit: Payer: Self-pay

## 2021-03-05 ENCOUNTER — Telehealth: Payer: Self-pay

## 2021-03-05 ENCOUNTER — Other Ambulatory Visit: Payer: Self-pay | Admitting: Internal Medicine

## 2021-03-05 VITALS — BP 118/68 | HR 66 | Temp 98.2°F | Resp 16 | Ht 63.0 in | Wt 140.6 lb

## 2021-03-05 DIAGNOSIS — L03114 Cellulitis of left upper limb: Secondary | ICD-10-CM

## 2021-03-05 MED ORDER — CLINDAMYCIN HCL 300 MG PO CAPS
300.0000 mg | ORAL_CAPSULE | Freq: Three times a day (TID) | ORAL | 0 refills | Status: DC
  Filled 2021-03-05: qty 30, 10d supply, fill #0

## 2021-03-05 NOTE — Telephone Encounter (Signed)
LVM with call back number in regards to setting up an appointment with Dr. Annamaria Boots for medication management, pt is requesting Klonopin and has not been seen since 2019.

## 2021-03-05 NOTE — Progress Notes (Signed)
Subjective:  Patient ID: Jodi Nunez., female    DOB: February 27, 1961  Age: 60 y.o. MRN: [id number]  CC:  Chief Complaint  Patient presents with   Cellulitis    Pt reports new redness and heat with no pain on LT arm, reports recurring 2 episodes 9 years and four years ago reports had to be seen in ER for those cases     HPI Jodi Meyering V. presents for   Cellulitis: Left arm.  Started yesterday with warmth of left arm and redness. No stings/bites. Low grade temp last night - 99.9. usually 97.9. chills last night, none today. Treated with ibuprofen last night. Wrapped last night, some recession of upper erythema.    Last cellulitis in 2018, and 5 years prior. Required IV abx at that time. Prior breast CA with mastectomy, lymph node dissection in 2001. Prior lymphedema, but not recurrent. No regular wraps needed.    Last cellulitis in 06/2017. Admitted 11/3-11/12/2016. Treated with IV clindamycin (PCN allergy) with significant improvement. Transitioned to oral clindamycin 381m Q6h for additional 6 days - tolerated without difficulty.    History Patient Active Problem List   Diagnosis Date Noted   Migraines 06/26/2017   Cellulitis of left arm 06/25/2017   Insomnia 01/29/2017   Past Medical History:  Diagnosis Date   Allergy    Breast cancer ([place] 2006   ER-/PR-/her2+   Menopausal and perimenopausal disorder    Migraines    Past Surgical History:  Procedure Laterality Date   BREAST SURGERY     CESAREAN SECTION     lymph node resection     MASTECTOMY     Allergies  Allergen Reactions   Penicillins Anaphylaxis    Has patient had a PCN reaction causing immediate rash, facial/tongue/throat swelling, SOB or lightheadedness with hypotension: Yes Has patient had a PCN reaction causing severe rash involving mucus membranes or skin necrosis: No Has patient had a PCN reaction that required hospitalization No Has patient had a PCN reaction occurring within the last 10 years:  Yes If all of the above answers are "NO", then may proceed with Cephalosporin use.    Prior to Admission medications   Medication Sig Start Date End Date Taking? Authorizing Provider  amphetamine-dextroamphetamine (ADDERALL) 10 MG tablet Take 10 mg by mouth daily as needed (to focus).  08/12/19  Yes [provider]  amphetamine-dextroamphetamine (ADDERALL) 10 MG tablet TAKE 1 TABLET BY MOUTH ONCE DAILY 03/04/21  Yes   Ascorbic Acid (VITAMIN C PO) Take 1 tablet by mouth daily.   Yes [provider]  b complex vitamins tablet Take 1 tablet by mouth daily.   Yes [provider]  CALCIUM PO Take 2 tablets by mouth 2 (two) times daily.    Yes [provider]  Cholecalciferol (VITAMIN D PO) Take 1 tablet by mouth daily.    Yes [provider]  clonazePAM (KLONOPIN) 0.5 MG tablet TAKE 1 TO 2 TABLETS BY MOUTH ONCE A DAY AS NEEDED FOR SLEEP 11/20/20 05/19/21 Yes YBaird LyonsD, MD  Diethylpropion HCl CR 75 MG TB24 TAKE 1 TABLET BY MOUTH DAILY 09/25/20 03/24/21 Yes GDian Queen MD  estradiol (ESTRACE) 0.1 MG/GM vaginal cream START WITH 2-4 GRAMS VAGINALLY NIGHTLY FOR 2 WEEKS. THEN DECREASE TO 1-2 GRAMS NIGHTLY FOR 1-2 WEEKS THEN 1 GRAM 1-3 TIMES WEEKLY AS DIRECTED 10/15/20 10/15/21 Yes [name], [name], NP  ibuprofen (ADVIL,MOTRIN) 200 MG tablet Take 600 mg by mouth every 6 (six) hours as needed for  moderate pain.   Yes [provider]  liothyronine (CYTOMEL) 5 MCG tablet TAKE 2 TABLETS BY MOUTH ONCE DAILY ON AN EMPTY STOMACH 06/19/20  Yes [provider]  liothyronine (CYTOMEL) 5 MCG tablet TAKE 2 TABLETS BY MOUTH ON AN EMPTY STOMACH ONCE A DAY 10/12/20 10/12/21 Yes Jamison, Earnest Bailey, FNP  liothyronine (CYTOMEL) 5 MCG tablet TAKE 2 TABLETS BY MOUTH ONCE DAILY ON AN EMPTY STOMACH 05/04/20 05/04/21 Yes Marny Lowenstein, PA-C  liothyronine (CYTOMEL) 5 MCG tablet Take 2 tablets (10 mcg total) by mouth daily on an empty stomach 12/18/20  Yes   Multiple Vitamin  (MULITIVITAMIN WITH MINERALS) TABS Take 1 tablet by mouth daily.   Yes [provider]  neomycin-polymyxin-hydrocortisone (CORTISPORIN) OTIC solution APPLY 1 TO 2 DROPS TO THE TOE AFTER SOAKING TWO TIMES DAILY 07/09/20 07/09/21 Yes Regal, Tamala Fothergill, DPM  nitrofurantoin, macrocrystal-monohydrate, (MACROBID) 100 MG capsule TAKE 1 CAPSULE BY MOUTH EVERY 12 HOURS 09/29/20 09/29/21 Yes [name], MD  PRESCRIPTION MEDICATION See admin instructions. Inject one testosterone tablet intramuscularly into hip once every 5-6 months - administered by Lyn Henri MD   Yes [provider]  rizatriptan (MAXALT) 10 MG tablet Take 10 mg by mouth daily as needed for migraine. 03/07/16  Yes [provider]  rizatriptan (MAXALT) 10 MG tablet TAKE 1 TABLET BY MOUTH ONCE AS NEEDED FOR MIGRAINE HEADACHE DO NOT EXCEED 3 TAB/24 HR 12/09/20 12/09/21 Yes [name], MD  spironolactone (ALDACTONE) 100 MG tablet Take 1 tablet (100 mg total) by mouth daily. 01/06/21  Yes   thyroid (ARMOUR THYROID) 15 MG tablet Take 1 tablet (15 mg total) by mouth daily on an empty stomach 12/18/20  Yes   thyroid (ARMOUR) 15 MG tablet Take 15 mg by mouth daily before breakfast.   Yes [provider]  thyroid (ARMOUR) 15 MG tablet TAKE 1 TABLET BY MOUTH ONCE A DAY ON AN EMPTY STOMACH AS DIRECTED 10/15/20 10/15/21 Yes [name], [name], NP  thyroid (ARMOUR) 15 MG tablet TAKE 1 TABLET BY MOUTH ONCE A DAY ON AN EMPTY STOMACH 08/18/20 08/18/21 Yes Marny Lowenstein, PA-C  thyroid (ARMOUR) 15 MG tablet TAKE 1 TABLET BY MOUTH ONCE A DAY ON AN EMPTY STOMACH 07/15/20 07/15/21 Yes Coralee North A, PA-C  valACYclovir (VALTREX) 500 MG tablet TAKE 1 TABLET BY MOUTH TWICE DAILY 06/16/20 06/16/21 Yes [name], MD  zolpidem (AMBIEN) 10 MG tablet Take 10 mg by mouth at bedtime. 03/07/16  Yes [provider]  zolpidem (AMBIEN) 10 MG tablet TAKE 1 TABLET BY MOUTH ONCE DAILY AT BEDTIME 09/14/20 03/13/21 Yes [name], MD   zolpidem (AMBIEN) 10 MG tablet TAKE 1 TABLET BY MOUTH ONCE DAILY AT BEDTIME 09/14/20 03/13/21 Yes [name], MD  zolpidem (AMBIEN) 10 MG tablet TAKE 1 TABLET BY MOUTH ONCE DAILY AT BEDTIME 12/10/20 06/08/21 Yes [name], MD  amphetamine-dextroamphetamine (ADDERALL) 10 MG tablet TAKE 1 TABLET BY MOUTH ONCE DAILY 07/08/20 01/04/21  [name], MD  zolpidem (AMBIEN) 10 MG tablet TAKE 1 TABLET BY MOUTH AT BEDTIME 06/16/20 12/13/20  [name], MD   Social History   Socioeconomic History   Marital status: Married    Spouse name: Not on file   Number of children: Not on file   Years of education: Not on file   Highest education level: Not on file  Occupational History   Not on file  Tobacco Use   Smoking status: Former    Packs/day: 0.20    Years: 10.00  Pack years: 2.00    Types: Cigarettes    Quit date: 01/25/1997    Years since quitting: 24.1   Smokeless tobacco: Never  Substance and Sexual Activity   Alcohol use: Yes    Comment: occasional   Drug use: No   Sexual activity: Not on file  Other Topics Concern   Not on file  Social History Narrative   Not on file   Social Determinants of Health   Financial Resource Strain: Not on file  Food Insecurity: Not on file  Transportation Needs: Not on file  Physical Activity: Not on file  Stress: Not on file  Social Connections: Not on file  Intimate Partner Violence: Not on file    Review of Systems Per HPI.   Objective:   Vitals:   03/05/21 1147  BP: 118/68  Pulse: 66  Resp: 16  Temp: 98.2 F (36.8 C)  TempSrc: Temporal  SpO2: 97%  Weight: 140 lb 9.6 oz (63.8 kg)  Height: 5' 3" (1.6 m)     Physical Exam Constitutional:      General: She is not in acute distress.    Appearance: Normal appearance. She is well-developed. She is not ill-appearing.  HENT:     Head: Normocephalic and atraumatic.  Cardiovascular:     Rate and Rhythm: Normal rate.  Pulmonary:     Effort: Pulmonary effort is  normal.  Skin:    Comments: Left arm warmth, erythema, slight soft tissue swelling, outlined with marker.  See photo.  Neurological:     Mental Status: She is alert and oriented to person, place, and time.  Psychiatric:        Mood and Affect: Mood normal.         Assessment & Plan:  Shilynn Hoch. is a 60 y.o. female . Left arm cellulitis - Plan: clindamycin (CLEOCIN) 300 MG capsule  -With prior mastectomy, lymph node dissection.  May be at increased risk of cellulitis, lymphedema, previous cellulitis.    -Afebrile, not tachycardic at this time.  Low-grade temp last night, better today.  Initial approach and outpatient treatment with clindamycin 337m tid for 10 days.  -Update by MyChart in the next 2 days, ER precautions given if any return of fever, progression of erythema or worsening.  RTC precautions given as well.   Meds ordered this encounter  Medications   clindamycin (CLEOCIN) 300 MG capsule    Sig: Take 1 capsule (300 mg total) by mouth 3 (three) times daily.    Dispense:  30 capsule    Refill:  0   Patient Instructions  Start clindamycin 3 times per day. Recheck on Monday if not significantly improving.  If fever or spread of redness, I do recommend ER eval.  Send me an update by Mychart next few days.  Return to the clinic or go to the nearest emergency room if any of your symptoms worsen or new symptoms occur. Cellulitis, Adult  Cellulitis is a skin infection. The infected area is usually warm, red, swollen, and tender. This condition occurs most often in the arms and lower legs. The infection can travel to the muscles, blood, and underlying tissue andbecome serious. It is very important to get treated for this condition. What are the causes? Cellulitis is caused by bacteria. The bacteria enter through a break in theskin, such as a cut, burn, insect bite, open sore, or crack. What increases the risk? This condition is more likely to occur in people who: Have a  weak body defense system (immune system). Have open wounds on the skin, such as cuts, burns, bites, and scrapes. Bacteria can enter the body through these open wounds. Are older than 58 years of age. Have diabetes. Have a type of long-lasting (chronic) liver disease (cirrhosis) or kidney disease. Are obese. Have a skin condition such as: Itchy rash (eczema). Slow movement of blood in the veins (venous stasis). Fluid buildup below the skin (edema). Have had radiation therapy. Use IV drugs. What are the signs or symptoms? Symptoms of this condition include: Redness, streaking, or spotting on the skin. Swollen area of the skin. Tenderness or pain when an area of the skin is touched. Warm skin. A fever. Chills. Blisters. How is this diagnosed? This condition is diagnosed based on a medical history and physical exam. You may also have tests, including: Blood tests. Imaging tests. How is this treated? Treatment for this condition may include: Medicines, such as antibiotic medicines or medicines to treat allergies (antihistamines). Supportive care, such as rest and application of cold or warm cloths (compresses) to the skin. Hospital care, if the condition is severe. The infection usually starts to get better within 1-2 days of treatment. Follow these instructions at home:  Medicines Take over-the-counter and prescription medicines only as told by your health care provider. If you were prescribed an antibiotic medicine, take it as told by your health care provider. Do not stop taking the antibiotic even if you start to feel better. General instructions Drink enough fluid to keep your urine pale yellow. Do not touch or rub the infected area. Raise (elevate) the infected area above the level of your heart while you are sitting or lying down. Apply warm or cold compresses to the affected area as told by your health care provider. Keep all follow-up visits as told by your health care  provider. This is important. These visits let your health care provider make sure a more serious infection is not developing. Contact a health care provider if: You have a fever. Your symptoms do not begin to improve within 1-2 days of starting treatment. Your bone or joint underneath the infected area becomes painful after the skin has healed. Your infection returns in the same area or another area. You notice a swollen bump in the infected area. You develop new symptoms. You have a general ill feeling (malaise) with muscle aches and pains. Get help right away if: Your symptoms get worse. You feel very sleepy. You develop vomiting or diarrhea that persists. You notice red streaks coming from the infected area. Your red area gets larger or turns dark in color. These symptoms may represent a serious problem that is an emergency. Do not wait to see if the symptoms will go away. Get medical help right away. Call your local emergency services (911 in the [place] Do not drive yourself to the hospital. Summary Cellulitis is a skin infection. This condition occurs most often in the arms and lower legs. Treatment for this condition may include medicines, such as antibiotic medicines or antihistamines. Take over-the-counter and prescription medicines only as told by your health care provider. If you were prescribed an antibiotic medicine, do not stop taking the antibiotic even if you start to feel better. Contact a health care provider if your symptoms do not begin to improve within 1-2 days of starting treatment or your symptoms get worse. Keep all follow-up visits as told by your health care provider. This is important. These visits let your health care provider  make sure that a more serious infection is not developing. This information is not intended to replace advice given to you by your health care provider. Make sure you discuss any questions you have with your healthcare provider. Document  Revised: 08/19/2019 Document Reviewed: 12/28/2017 Elsevier Patient Education  2022 [place],   Merri Ray, MD [place], [facility] 03/05/21 1:00 PM

## 2021-03-05 NOTE — Patient Instructions (Signed)
Start clindamycin 3 times per day. Recheck on Monday if not significantly improving.  If fever or spread of redness, I do recommend ER eval.  Send me an update by Mychart next few days.  Return to the clinic or go to the nearest emergency room if any of your symptoms worsen or new symptoms occur. Cellulitis, Adult  Cellulitis is a skin infection. The infected area is usually warm, red, swollen, and tender. This condition occurs most often in the arms and lower legs. The infection can travel to the muscles, blood, and underlying tissue andbecome serious. It is very important to get treated for this condition. What are the causes? Cellulitis is caused by bacteria. The bacteria enter through a break in theskin, such as a cut, burn, insect bite, open sore, or crack. What increases the risk? This condition is more likely to occur in people who: Have a weak body defense system (immune system). Have open wounds on the skin, such as cuts, burns, bites, and scrapes. Bacteria can enter the body through these open wounds. Are older than 58 years of age. Have diabetes. Have a type of long-lasting (chronic) liver disease (cirrhosis) or kidney disease. Are obese. Have a skin condition such as: Itchy rash (eczema). Slow movement of blood in the veins (venous stasis). Fluid buildup below the skin (edema). Have had radiation therapy. Use IV drugs. What are the signs or symptoms? Symptoms of this condition include: Redness, streaking, or spotting on the skin. Swollen area of the skin. Tenderness or pain when an area of the skin is touched. Warm skin. A fever. Chills. Blisters. How is this diagnosed? This condition is diagnosed based on a medical history and physical exam. You may also have tests, including: Blood tests. Imaging tests. How is this treated? Treatment for this condition may include: Medicines, such as antibiotic medicines or medicines to treat allergies (antihistamines). Supportive  care, such as rest and application of cold or warm cloths (compresses) to the skin. Hospital care, if the condition is severe. The infection usually starts to get better within 1-2 days of treatment. Follow these instructions at home:  Medicines Take over-the-counter and prescription medicines only as told by your health care provider. If you were prescribed an antibiotic medicine, take it as told by your health care provider. Do not stop taking the antibiotic even if you start to feel better. General instructions Drink enough fluid to keep your urine pale yellow. Do not touch or rub the infected area. Raise (elevate) the infected area above the level of your heart while you are sitting or lying down. Apply warm or cold compresses to the affected area as told by your health care provider. Keep all follow-up visits as told by your health care provider. This is important. These visits let your health care provider make sure a more serious infection is not developing. Contact a health care provider if: You have a fever. Your symptoms do not begin to improve within 1-2 days of starting treatment. Your bone or joint underneath the infected area becomes painful after the skin has healed. Your infection returns in the same area or another area. You notice a swollen bump in the infected area. You develop new symptoms. You have a general ill feeling (malaise) with muscle aches and pains. Get help right away if: Your symptoms get worse. You feel very sleepy. You develop vomiting or diarrhea that persists. You notice red streaks coming from the infected area. Your red area gets larger or turns dark  in color. These symptoms may represent a serious problem that is an emergency. Do not wait to see if the symptoms will go away. Get medical help right away. Call your local emergency services (911 in the [place] Do not drive yourself to the hospital. Summary Cellulitis is a skin infection. This condition  occurs most often in the arms and lower legs. Treatment for this condition may include medicines, such as antibiotic medicines or antihistamines. Take over-the-counter and prescription medicines only as told by your health care provider. If you were prescribed an antibiotic medicine, do not stop taking the antibiotic even if you start to feel better. Contact a health care provider if your symptoms do not begin to improve within 1-2 days of starting treatment or your symptoms get worse. Keep all follow-up visits as told by your health care provider. This is important. These visits let your health care provider make sure that a more serious infection is not developing. This information is not intended to replace advice given to you by your health care provider. Make sure you discuss any questions you have with your healthcare provider. Document Revised: 08/19/2019 Document Reviewed: 12/28/2017 Elsevier Patient Education  [place].

## 2021-03-05 NOTE — Telephone Encounter (Signed)
Made pt appt at 11:40 with Dr Carlota Raspberry

## 2021-03-06 ENCOUNTER — Encounter: Payer: Self-pay | Admitting: Family Medicine

## 2021-03-08 ENCOUNTER — Telehealth: Payer: Self-pay | Admitting: Internal Medicine

## 2021-03-08 ENCOUNTER — Encounter: Payer: Self-pay | Admitting: Family Medicine

## 2021-03-08 ENCOUNTER — Ambulatory Visit: Payer: No Typology Code available for payment source | Admitting: Internal Medicine

## 2021-03-08 ENCOUNTER — Other Ambulatory Visit (HOSPITAL_COMMUNITY): Payer: Self-pay

## 2021-03-08 ENCOUNTER — Telehealth: Payer: Self-pay

## 2021-03-08 DIAGNOSIS — G4700 Insomnia, unspecified: Secondary | ICD-10-CM

## 2021-03-08 MED ORDER — CLONAZEPAM 0.5 MG PO TABS
ORAL_TABLET | ORAL | 5 refills | Status: DC
  Filled 2021-03-08: qty 30, 15d supply, fill #0
  Filled 2021-03-24: qty 30, 15d supply, fill #1
  Filled 2021-04-07: qty 30, 15d supply, fill #2

## 2021-03-08 NOTE — Telephone Encounter (Signed)
Clonazepam refilled Keep August office appointment

## 2021-03-08 NOTE — Telephone Encounter (Signed)
Appears he has followed her prior. Will place referral if needed for insurance.

## 2021-03-08 NOTE — Telephone Encounter (Signed)
Clonazepam refilled. Keep August office appointment.

## 2021-03-08 NOTE — Telephone Encounter (Signed)
disregard

## 2021-03-08 NOTE — Telephone Encounter (Signed)
Pt wants a renewal on a referral to [facility] for sleep specialist   Pt call back [phone]

## 2021-03-09 ENCOUNTER — Other Ambulatory Visit (HOSPITAL_COMMUNITY): Payer: Self-pay

## 2021-03-09 NOTE — Telephone Encounter (Signed)
Called and made patient aware of refill. Advised her to keep appointment scheduled for August. She expressed understanding. Nothing further needed at this time.

## 2021-03-10 NOTE — Telephone Encounter (Signed)
Created in error

## 2021-03-11 ENCOUNTER — Other Ambulatory Visit (HOSPITAL_COMMUNITY): Payer: Self-pay

## 2021-03-11 MED FILL — Diethylpropion HCl Tab ER 24HR 75 MG: ORAL | 30 days supply | Qty: 30 | Fill #1 | Status: AC

## 2021-03-15 ENCOUNTER — Other Ambulatory Visit: Payer: Self-pay

## 2021-03-16 ENCOUNTER — Other Ambulatory Visit (HOSPITAL_COMMUNITY): Payer: Self-pay

## 2021-03-16 MED ORDER — ZOLPIDEM TARTRATE 10 MG PO TABS
10.0000 mg | ORAL_TABLET | Freq: Every day | ORAL | 0 refills | Status: DC
  Filled 2021-03-16: qty 90, 90d supply, fill #0

## 2021-03-17 ENCOUNTER — Other Ambulatory Visit (HOSPITAL_COMMUNITY): Payer: Self-pay

## 2021-03-19 ENCOUNTER — Other Ambulatory Visit (HOSPITAL_COMMUNITY): Payer: Self-pay

## 2021-03-19 MED ORDER — FREESTYLE LIBRE 14 DAY SENSOR MISC
1 refills | Status: DC
  Filled 2021-03-19: qty 2, 28d supply, fill #0

## 2021-03-25 ENCOUNTER — Telehealth: Payer: Self-pay | Admitting: Family Medicine

## 2021-03-25 ENCOUNTER — Other Ambulatory Visit (HOSPITAL_COMMUNITY): Payer: Self-pay

## 2021-03-25 DIAGNOSIS — M25562 Pain in left knee: Secondary | ICD-10-CM

## 2021-03-25 DIAGNOSIS — M25561 Pain in right knee: Secondary | ICD-10-CM

## 2021-03-25 NOTE — Telephone Encounter (Signed)
Patient needs a referral to [facility] - [facility]

## 2021-03-26 ENCOUNTER — Other Ambulatory Visit (HOSPITAL_COMMUNITY): Payer: Self-pay

## 2021-03-27 NOTE — Telephone Encounter (Signed)
Referral placed.

## 2021-04-05 ENCOUNTER — Other Ambulatory Visit (HOSPITAL_COMMUNITY): Payer: Self-pay

## 2021-04-08 ENCOUNTER — Other Ambulatory Visit (HOSPITAL_COMMUNITY): Payer: Self-pay

## 2021-04-08 MED ORDER — PROGESTERONE MICRONIZED 100 MG PO CAPS
ORAL_CAPSULE | ORAL | 1 refills | Status: DC
  Filled 2021-04-08: qty 80, 90d supply, fill #0
  Filled 2021-06-06: qty 80, 90d supply, fill #1

## 2021-04-19 NOTE — Progress Notes (Signed)
HPI female former smoker followed for chronic insomnia, shift work sleep disorder complicated by history breast cancer  ------------------------------------------------------------------------------------------   07/13/2018- 60 year old female former smoker followed for chronic insomnia, shift work sleep disorder complicated by history breast cancer -----Follows for: Insomnia, Klonopin has helped her sleep through the night,  still using Ambien as well, Adderall is prescribed but she doesnt take this everyday Clonazepam 0.5-0.75 mg plus Ambien 10 mg, Adderall 20 mg, melatonin, Maxalt Continues to work as a Therapist, music with some shift changes.  Usually uses clonazepam 0.5 mg tablet with Ambien on weekends and 0.75 mg with Ambien on workdays.  Says with this she sleeps comfortably, wakes easily and feels well in the morning with no carryover.  Denies problems with memory or daytime alertness.  The Ambien is prescribed by her GYN. Goes to gym regularly.  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 Feels she is doing ok with sleep. We did discuss changing table size to 1 mg clonazepam. Also discussed CBT alternative.  ROS-see HPI   "+" = positive Constitutional:    weight loss, night sweats, fevers, chills, fatigue, lassitude. HEENT:    headaches, difficulty swallowing, tooth/dental problems, sore throat,       sneezing, itching, ear ache, nasal congestion, post nasal drip, snoring CV:    chest pain, orthopnea, PND, swelling in lower extremities, anasarca,                                                             dizziness, palpitations Resp:   shortness of breath with exertion  or at rest.                productive cough,   non-productive cough, coughing up of blood.              change in color of mucus.  wheezing.   Skin:    rash or lesions. GI:  No-   heartburn, indigestion, abdominal pain, nausea, vomiting, diarrhea,                 change in bowel habits, loss of appetite GU: dysuria, change in color of urine, no urgency or frequency.   flank pain. MS:   joint pain, stiffness, decreased range of motion, back pain. Neuro-     nothing unusual Psych:  change in mood or affect.  depression or anxiety.   memory loss.  OBJ- Physical Exam General- Alert, Oriented, Affect-appropriate, Distress- none acute Skin- rash-none, lesions- none, excoriation- none Lymphadenopathy- none Head- atraumatic            Eyes- Gross vision intact, PERRLA, conjunctivae and secretions clear            Ears- Hearing, canals-normal            Nose- Clear, no-Septal dev, mucus, polyps, erosion, perforation             Throat- Mallampati II , mucosa clear , drainage- none, tonsils- atrophic Neck- flexible , trachea midline, no stridor , thyroid nl, carotid no bruit Chest -  symmetrical excursion , unlabored           Heart/CV- RRR , no murmur , no gallop  , no rub, nl s1 s2                           - JVD- none , edema- none, stasis changes- none, varices- none           Lung- clear to P&A, wheeze- none, cough- none , dullness-none, rub- none           Chest wall- reported bilateral mastectomy with implant reconstruction Abd-  Br/ Gen/ Rectal- Not done, not indicated Extrem- cyanosis- none, clubbing, none, atrophy- none, strength- nl Neuro- grossly intact to observation

## 2021-04-20 ENCOUNTER — Ambulatory Visit: Payer: No Typology Code available for payment source | Admitting: Internal Medicine

## 2021-04-20 ENCOUNTER — Other Ambulatory Visit (HOSPITAL_COMMUNITY): Payer: Self-pay

## 2021-04-20 ENCOUNTER — Encounter: Payer: Self-pay | Admitting: Internal Medicine

## 2021-04-20 ENCOUNTER — Other Ambulatory Visit: Payer: Self-pay

## 2021-04-20 DIAGNOSIS — G4726 Circadian rhythm sleep disorder, shift work type: Secondary | ICD-10-CM

## 2021-04-20 DIAGNOSIS — F5101 Primary insomnia: Secondary | ICD-10-CM

## 2021-04-20 MED ORDER — CLONAZEPAM 1 MG PO TABS
ORAL_TABLET | ORAL | 5 refills | Status: DC
  Filled 2021-04-20: qty 30, 30d supply, fill #0
  Filled 2021-05-19: qty 30, 30d supply, fill #1
  Filled 2021-06-20: qty 30, 30d supply, fill #2
  Filled 2021-07-16 – 2021-07-19 (×2): qty 30, 30d supply, fill #3
  Filled 2021-08-18: qty 30, 30d supply, fill #4
  Filled 2021-09-18: qty 30, 30d supply, fill #5

## 2021-04-20 NOTE — Patient Instructions (Addendum)
We changed your clonazepam to 1 mg tabs so you can take 1/2 or 1 tab for sleep  As discussed, you might look into Cognitive Behavioral Therapy for insomnia as a non medication adjunct.   A local group is [facility]  [phone] [address]     [phone]  Please call if we can help

## 2021-04-29 ENCOUNTER — Other Ambulatory Visit (HOSPITAL_COMMUNITY): Payer: Self-pay

## 2021-04-30 ENCOUNTER — Other Ambulatory Visit (HOSPITAL_COMMUNITY): Payer: Self-pay

## 2021-05-01 ENCOUNTER — Other Ambulatory Visit (HOSPITAL_COMMUNITY): Payer: Self-pay

## 2021-05-03 ENCOUNTER — Encounter: Payer: Self-pay | Admitting: Internal Medicine

## 2021-05-03 ENCOUNTER — Other Ambulatory Visit (HOSPITAL_COMMUNITY): Payer: Self-pay

## 2021-05-03 DIAGNOSIS — G4726 Circadian rhythm sleep disorder, shift work type: Secondary | ICD-10-CM | POA: Insufficient documentation

## 2021-05-03 MED ORDER — AMPHETAMINE-DEXTROAMPHETAMINE 10 MG PO TABS
10.0000 mg | ORAL_TABLET | Freq: Every day | ORAL | 0 refills | Status: DC
  Filled 2021-05-03: qty 30, 30d supply, fill #0

## 2021-05-03 NOTE — Assessment & Plan Note (Signed)
Reviewed sleep environment and adjustments for shift workers.

## 2021-05-03 NOTE — Assessment & Plan Note (Signed)
Discussed CBT for help with insomnia. Contact information for provider given. Changing to 1 mg clonazepam tabs.

## 2021-05-04 ENCOUNTER — Other Ambulatory Visit (HOSPITAL_COMMUNITY): Payer: Self-pay

## 2021-05-13 ENCOUNTER — Other Ambulatory Visit (HOSPITAL_COMMUNITY): Payer: Self-pay

## 2021-05-13 MED ORDER — ACETYLCYSTEINE 600 MG PO CAPS
ORAL_CAPSULE | ORAL | 0 refills | Status: DC
  Filled 2021-05-13: qty 100, 100d supply, fill #0

## 2021-05-13 MED ORDER — PROGESTERONE MICRONIZED 100 MG PO CAPS
ORAL_CAPSULE | ORAL | 1 refills | Status: DC
  Filled 2021-05-13: qty 80, 90d supply, fill #0

## 2021-05-14 ENCOUNTER — Other Ambulatory Visit (HOSPITAL_COMMUNITY): Payer: Self-pay

## 2021-05-20 ENCOUNTER — Other Ambulatory Visit (HOSPITAL_COMMUNITY): Payer: Self-pay

## 2021-06-03 ENCOUNTER — Other Ambulatory Visit (HOSPITAL_COMMUNITY): Payer: Self-pay

## 2021-06-03 MED ORDER — ESZOPICLONE 2 MG PO TABS
ORAL_TABLET | ORAL | 3 refills | Status: DC
  Filled 2021-06-03: qty 30, 30d supply, fill #0
  Filled 2021-06-29: qty 30, 30d supply, fill #1

## 2021-06-07 ENCOUNTER — Other Ambulatory Visit (HOSPITAL_COMMUNITY): Payer: Self-pay

## 2021-06-07 MED ORDER — PROGESTERONE MICRONIZED 100 MG PO CAPS
200.0000 mg | ORAL_CAPSULE | Freq: Every evening | ORAL | 3 refills | Status: DC
  Filled 2021-06-07: qty 150, 90d supply, fill #0
  Filled 2021-11-11: qty 150, 90d supply, fill #1

## 2021-06-08 ENCOUNTER — Other Ambulatory Visit (HOSPITAL_COMMUNITY): Payer: Self-pay

## 2021-06-08 MED ORDER — AMPHETAMINE-DEXTROAMPHETAMINE 10 MG PO TABS
10.0000 mg | ORAL_TABLET | Freq: Every day | ORAL | 0 refills | Status: DC
  Filled 2021-06-08: qty 30, 30d supply, fill #0

## 2021-06-21 ENCOUNTER — Other Ambulatory Visit (HOSPITAL_COMMUNITY): Payer: Self-pay

## 2021-06-30 ENCOUNTER — Other Ambulatory Visit (HOSPITAL_COMMUNITY): Payer: Self-pay

## 2021-07-07 ENCOUNTER — Other Ambulatory Visit (HOSPITAL_COMMUNITY): Payer: Self-pay

## 2021-07-16 ENCOUNTER — Other Ambulatory Visit (HOSPITAL_COMMUNITY): Payer: Self-pay

## 2021-07-19 ENCOUNTER — Other Ambulatory Visit: Payer: Self-pay

## 2021-07-19 ENCOUNTER — Other Ambulatory Visit (HOSPITAL_COMMUNITY): Payer: Self-pay

## 2021-07-19 MED ORDER — RIZATRIPTAN BENZOATE 10 MG PO TABS
ORAL_TABLET | ORAL | 0 refills | Status: DC
  Filled 2021-07-19: qty 18, 30d supply, fill #0

## 2021-07-21 ENCOUNTER — Other Ambulatory Visit (HOSPITAL_COMMUNITY): Payer: Self-pay

## 2021-07-22 ENCOUNTER — Other Ambulatory Visit (HOSPITAL_COMMUNITY): Payer: Self-pay

## 2021-07-22 MED ORDER — ZOLPIDEM TARTRATE 10 MG PO TABS
10.0000 mg | ORAL_TABLET | Freq: Every day | ORAL | 0 refills | Status: DC
  Filled 2021-07-22: qty 90, 90d supply, fill #0

## 2021-08-04 ENCOUNTER — Other Ambulatory Visit (HOSPITAL_COMMUNITY): Payer: Self-pay

## 2021-08-04 MED ORDER — BETAMETHASONE DIPROPIONATE AUG 0.05 % EX LOTN
TOPICAL_LOTION | CUTANEOUS | 3 refills | Status: DC
  Filled 2021-08-04: qty 60, 30d supply, fill #0

## 2021-08-05 ENCOUNTER — Other Ambulatory Visit (HOSPITAL_COMMUNITY): Payer: Self-pay

## 2021-08-19 ENCOUNTER — Other Ambulatory Visit (HOSPITAL_COMMUNITY): Payer: Self-pay

## 2021-08-26 ENCOUNTER — Other Ambulatory Visit (HOSPITAL_COMMUNITY): Payer: Self-pay

## 2021-08-30 ENCOUNTER — Other Ambulatory Visit (HOSPITAL_COMMUNITY): Payer: Self-pay

## 2021-08-31 ENCOUNTER — Other Ambulatory Visit (HOSPITAL_COMMUNITY): Payer: Self-pay

## 2021-08-31 MED ORDER — AMPHETAMINE-DEXTROAMPHETAMINE 10 MG PO TABS
10.0000 mg | ORAL_TABLET | Freq: Every day | ORAL | 0 refills | Status: DC
  Filled 2021-08-31: qty 30, 30d supply, fill #0

## 2021-09-09 ENCOUNTER — Other Ambulatory Visit: Payer: Self-pay

## 2021-09-10 ENCOUNTER — Other Ambulatory Visit (HOSPITAL_COMMUNITY): Payer: Self-pay

## 2021-09-10 MED ORDER — RIZATRIPTAN BENZOATE 10 MG PO TABS
ORAL_TABLET | ORAL | 3 refills | Status: DC
  Filled 2021-09-10: qty 18, 30d supply, fill #0
  Filled 2021-10-22: qty 18, 30d supply, fill #1
  Filled 2022-01-01: qty 18, 30d supply, fill #2
  Filled 2022-01-31: qty 18, 30d supply, fill #3

## 2021-09-20 ENCOUNTER — Other Ambulatory Visit (HOSPITAL_COMMUNITY): Payer: Self-pay

## 2021-10-01 ENCOUNTER — Other Ambulatory Visit (HOSPITAL_COMMUNITY): Payer: Self-pay

## 2021-10-01 MED ORDER — OLUMIANT 2 MG PO TABS
ORAL_TABLET | ORAL | 2 refills | Status: DC
  Filled 2021-10-01: qty 30, 30d supply, fill #0

## 2021-10-04 ENCOUNTER — Other Ambulatory Visit (HOSPITAL_COMMUNITY): Payer: Self-pay

## 2021-10-04 ENCOUNTER — Ambulatory Visit: Payer: No Typology Code available for payment source | Attending: Family Medicine | Admitting: Pharmacist

## 2021-10-04 ENCOUNTER — Other Ambulatory Visit: Payer: Self-pay | Admitting: Pharmacist

## 2021-10-04 ENCOUNTER — Other Ambulatory Visit (HOSPITAL_BASED_OUTPATIENT_CLINIC_OR_DEPARTMENT_OTHER): Payer: Self-pay

## 2021-10-04 ENCOUNTER — Other Ambulatory Visit: Payer: Self-pay

## 2021-10-04 DIAGNOSIS — Z7189 Other specified counseling: Secondary | ICD-10-CM

## 2021-10-04 MED ORDER — BARICITINIB 2 MG PO TABS
ORAL_TABLET | ORAL | 2 refills | Status: DC
  Filled 2021-10-04: qty 30, fill #0
  Filled 2021-10-04: qty 30, 30d supply, fill #0

## 2021-10-04 NOTE — Progress Notes (Signed)
° °  S: Patient presents today for review of their specialty medication.   Patient is currently taking Olumiant for alopecia. Patient is managed by Dr. Pearline Cables for this.   Dosing: 2mg  daily initially. May increase to 4mg  if response is not adequate.   Adherence: confirmed   Efficacy: patient does not note any improvement so far  Monitoring:  -CBC, LFTs: WNL -Lipids: LDL mildly elevated on last check (06/29/2020) -Hepatitis screening: last HepC screen in system negative (06/29/2020) -Symptoms of GI perforation: none reported   -Skin structure or texture changes: none reported  -S/sx of infection: none reported   Current adverse effects: -None   O:     Lab Results  Component Value Date   WBC 8.8 11/03/2019   HGB 12.0 11/03/2019   HCT 36.2 11/03/2019   MCV 97.3 11/03/2019   PLT 301 11/03/2019      Chemistry      Component Value Date/Time   NA 142 06/29/2020 1149   K 4.8 06/29/2020 1149   CL 105 06/29/2020 1149   CO2 23 06/29/2020 1149   BUN 16 06/29/2020 1149   CREATININE 0.82 06/29/2020 1149      Component Value Date/Time   CALCIUM 9.9 06/29/2020 1149   ALKPHOS 92 06/29/2020 1149   AST 37 06/29/2020 1149   ALT 33 (H) 06/29/2020 1149   BILITOT 0.3 06/29/2020 1149       A/P: 1. Medication review: patient currently on Olumiant for alopecia. She has been taking this medication for about 2-3 weeks now. Reviewed the medication with the patient. Baricitinib is a JAK inhibitor used for alopecia. Dose adjustment can be required for altered kidney, function, however, this patient's kidney function is normal. Use is not recommended in severe hepatotoxicity. Patient's most recent liver function is normal. Additionally, dose must be adjusted for lymphopenia, neutropenia, or anemia that occurs while on therapy. The medication may be administered orally with or withou food, however, patients should be swallowed whole. Infections are the most common adverse reactions to Olumiant,  however, other adverse effects can occur such as GI perforation, altered blood counts, impaired liver function, hypersensitivities, lipid abnormalities, and/or malignancies. CBC and LFTs should be monitored throughout tx periodically, as well as renal function, signs of infection and/or lipid tests, viral hepatitis panels, abdominal symptoms, and skin exams.   Benard Halsted, PharmD, [facility] [place] [phone]

## 2021-10-17 ENCOUNTER — Other Ambulatory Visit: Payer: Self-pay | Admitting: Internal Medicine

## 2021-10-18 ENCOUNTER — Other Ambulatory Visit (HOSPITAL_COMMUNITY): Payer: Self-pay

## 2021-10-18 MED ORDER — CLONAZEPAM 1 MG PO TABS
ORAL_TABLET | ORAL | 5 refills | Status: DC
  Filled 2021-10-18: qty 30, 30d supply, fill #0
  Filled 2021-11-14: qty 30, 30d supply, fill #1
  Filled 2021-12-14: qty 30, 30d supply, fill #2
  Filled 2022-01-13: qty 30, 30d supply, fill #3
  Filled 2022-02-13: qty 30, 30d supply, fill #4
  Filled 2022-03-14: qty 30, 30d supply, fill #5

## 2021-10-18 NOTE — Telephone Encounter (Signed)
Please advise patient requesting refill °

## 2021-10-20 ENCOUNTER — Other Ambulatory Visit (HOSPITAL_COMMUNITY): Payer: Self-pay

## 2021-10-20 MED ORDER — AZITHROMYCIN 250 MG PO TABS
ORAL_TABLET | ORAL | 2 refills | Status: DC
  Filled 2021-10-20 – 2021-10-25 (×2): qty 6, 5d supply, fill #0

## 2021-10-21 ENCOUNTER — Other Ambulatory Visit (HOSPITAL_COMMUNITY): Payer: Self-pay

## 2021-10-22 ENCOUNTER — Other Ambulatory Visit (HOSPITAL_COMMUNITY): Payer: Self-pay

## 2021-10-25 ENCOUNTER — Other Ambulatory Visit (HOSPITAL_COMMUNITY): Payer: Self-pay

## 2021-10-27 ENCOUNTER — Other Ambulatory Visit (HOSPITAL_COMMUNITY): Payer: Self-pay

## 2021-10-27 MED ORDER — CLONAZEPAM 1 MG PO TABS
ORAL_TABLET | ORAL | 3 refills | Status: DC
  Filled 2021-10-27 – 2022-04-12 (×2): qty 90, 30d supply, fill #0

## 2021-10-27 MED ORDER — AMPHETAMINE-DEXTROAMPHETAMINE 10 MG PO TABS
10.0000 mg | ORAL_TABLET | Freq: Every day | ORAL | 0 refills | Status: DC
  Filled 2021-10-27: qty 30, 30d supply, fill #0

## 2021-10-28 ENCOUNTER — Other Ambulatory Visit (HOSPITAL_COMMUNITY): Payer: Self-pay

## 2021-11-02 ENCOUNTER — Other Ambulatory Visit (HOSPITAL_COMMUNITY): Payer: Self-pay

## 2021-11-11 ENCOUNTER — Other Ambulatory Visit (HOSPITAL_COMMUNITY): Payer: Self-pay

## 2021-11-15 ENCOUNTER — Other Ambulatory Visit (HOSPITAL_COMMUNITY): Payer: Self-pay

## 2021-11-25 ENCOUNTER — Other Ambulatory Visit (HOSPITAL_COMMUNITY): Payer: Self-pay

## 2021-11-29 ENCOUNTER — Other Ambulatory Visit (HOSPITAL_COMMUNITY): Payer: Self-pay

## 2021-12-14 ENCOUNTER — Other Ambulatory Visit (HOSPITAL_COMMUNITY): Payer: Self-pay

## 2021-12-15 ENCOUNTER — Other Ambulatory Visit (HOSPITAL_COMMUNITY): Payer: Self-pay

## 2021-12-15 MED ORDER — ZOLPIDEM TARTRATE 10 MG PO TABS
10.0000 mg | ORAL_TABLET | Freq: Every day | ORAL | 0 refills | Status: DC
  Filled 2021-12-15: qty 90, 90d supply, fill #0

## 2021-12-26 ENCOUNTER — Other Ambulatory Visit: Payer: Self-pay

## 2021-12-27 ENCOUNTER — Other Ambulatory Visit (HOSPITAL_COMMUNITY): Payer: Self-pay

## 2021-12-27 MED ORDER — DIETHYLPROPION HCL ER 75 MG PO TB24
ORAL_TABLET | ORAL | 3 refills | Status: DC
  Filled 2021-12-27: qty 30, 30d supply, fill #0
  Filled 2022-01-31: qty 30, 30d supply, fill #1

## 2021-12-31 ENCOUNTER — Other Ambulatory Visit (HOSPITAL_COMMUNITY): Payer: Self-pay

## 2022-01-01 ENCOUNTER — Other Ambulatory Visit (HOSPITAL_COMMUNITY): Payer: Self-pay

## 2022-01-03 ENCOUNTER — Other Ambulatory Visit (HOSPITAL_COMMUNITY): Payer: Self-pay

## 2022-01-05 ENCOUNTER — Other Ambulatory Visit (HOSPITAL_COMMUNITY): Payer: Self-pay

## 2022-01-05 MED ORDER — FINASTERIDE 5 MG PO TABS
ORAL_TABLET | ORAL | 5 refills | Status: DC
  Filled 2022-01-05: qty 30, 30d supply, fill #0
  Filled 2022-01-31: qty 30, 30d supply, fill #1
  Filled 2022-03-04: qty 30, 30d supply, fill #2
  Filled 2022-04-03: qty 30, 30d supply, fill #3
  Filled 2022-05-07: qty 30, 30d supply, fill #4
  Filled 2022-06-06: qty 30, 30d supply, fill #5

## 2022-01-14 ENCOUNTER — Other Ambulatory Visit (HOSPITAL_COMMUNITY): Payer: Self-pay

## 2022-01-21 ENCOUNTER — Other Ambulatory Visit (HOSPITAL_COMMUNITY): Payer: Self-pay

## 2022-01-21 MED ORDER — METRONIDAZOLE 0.75 % VA GEL
VAGINAL | 0 refills | Status: DC
  Filled 2022-01-21: qty 70, 5d supply, fill #0

## 2022-01-24 ENCOUNTER — Other Ambulatory Visit (HOSPITAL_COMMUNITY): Payer: Self-pay

## 2022-01-24 MED ORDER — AMPHETAMINE-DEXTROAMPHETAMINE 10 MG PO TABS
10.0000 mg | ORAL_TABLET | Freq: Every day | ORAL | 0 refills | Status: DC
  Filled 2022-01-24: qty 30, 30d supply, fill #0

## 2022-01-31 ENCOUNTER — Other Ambulatory Visit (HOSPITAL_COMMUNITY): Payer: Self-pay

## 2022-02-01 ENCOUNTER — Other Ambulatory Visit (HOSPITAL_COMMUNITY): Payer: Self-pay

## 2022-02-02 ENCOUNTER — Other Ambulatory Visit (HOSPITAL_COMMUNITY): Payer: Self-pay

## 2022-02-02 MED ORDER — ESTRADIOL 0.1 MG/GM VA CREA
TOPICAL_CREAM | VAGINAL | 3 refills | Status: DC
  Filled 2022-02-02: qty 42.5, 85d supply, fill #0
  Filled 2022-09-06: qty 42.5, 85d supply, fill #1

## 2022-02-14 ENCOUNTER — Other Ambulatory Visit (HOSPITAL_COMMUNITY): Payer: Self-pay

## 2022-03-05 ENCOUNTER — Other Ambulatory Visit (HOSPITAL_COMMUNITY): Payer: Self-pay

## 2022-03-07 ENCOUNTER — Other Ambulatory Visit (HOSPITAL_COMMUNITY): Payer: Self-pay

## 2022-03-07 MED ORDER — AMPHETAMINE-DEXTROAMPHETAMINE 10 MG PO TABS
10.0000 mg | ORAL_TABLET | Freq: Every day | ORAL | 0 refills | Status: DC
  Filled 2022-03-07: qty 30, 30d supply, fill #0

## 2022-03-15 ENCOUNTER — Other Ambulatory Visit (HOSPITAL_COMMUNITY): Payer: Self-pay

## 2022-03-29 ENCOUNTER — Encounter: Payer: Self-pay | Admitting: Internal Medicine

## 2022-03-29 ENCOUNTER — Ambulatory Visit: Payer: No Typology Code available for payment source | Admitting: Internal Medicine

## 2022-03-29 DIAGNOSIS — G4726 Circadian rhythm sleep disorder, shift work type: Secondary | ICD-10-CM

## 2022-03-29 DIAGNOSIS — F5101 Primary insomnia: Secondary | ICD-10-CM

## 2022-03-29 NOTE — Assessment & Plan Note (Signed)
Doing better and gradually backing off on medication.  No longer working shift work schedule.  We discussed impact of upcoming job change. Plan-okay to continue current meds

## 2022-03-29 NOTE — Progress Notes (Signed)
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 Feels she is doing ok with sleep. We did discuss changing tablet size to 1 mg clonazepam. Also discussed CBT alternative.  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 Getting approx 5-7hrs/night of sleep Using clonazepam 1 mg most nights.  Sometimes adds one half of a 10 mg Ambien, never more.  Often does not use Ambien at all.,  Uses Adderall one half of a 10 mg tab if any but often none of this.  No longer working rotating shifts.  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  We discussed drive back and forth to [facility]. Daily exercise at the gym.  ROS-see HPI   "+" = positive Constitutional:    weight loss, night sweats, fevers, chills, fatigue, lassitude. HEENT:    headaches, difficulty swallowing, tooth/dental problems, sore throat,       sneezing, itching, ear ache, nasal congestion, post nasal drip, snoring CV:    chest pain, orthopnea, PND, swelling in lower extremities, anasarca,                                                              dizziness, palpitations Resp:   shortness of breath with exertion or at rest.                productive cough,   non-productive cough, coughing up of blood.              change in color of mucus.  wheezing.   Skin:    rash or lesions. GI:  No-   heartburn, indigestion, abdominal pain, nausea, vomiting, diarrhea,                 change in bowel habits, loss of appetite GU: dysuria, change in color of urine, no urgency or frequency.   flank pain. MS:   joint pain, stiffness, decreased range of motion, back pain. Neuro-     nothing unusual Psych:  change in mood or affect.  depression or anxiety.   memory loss.  OBJ- Physical Exam General- Alert, Oriented, Affect-appropriate, Distress- none acute Skin- rash-none, lesions- none, excoriation- none Lymphadenopathy- none Head- atraumatic            Eyes- Gross vision intact, PERRLA, conjunctivae and secretions clear            Ears- Hearing, canals-normal            Nose- Clear, no-Septal dev, mucus, polyps, erosion, perforation             Throat- Mallampati II , mucosa clear , drainage- none,  tonsils- atrophic Neck- flexible , trachea midline, no stridor , thyroid nl, carotid no bruit Chest - symmetrical excursion , unlabored           Heart/CV- RRR , no murmur , no gallop  , no rub, nl s1 s2                           - JVD- none , edema- none, stasis changes- none, varices- none           Lung- clear to P&A, wheeze- none, cough- none , dullness-none, rub- none           Chest wall- +reported bilateral mastectomy with implant reconstruction Abd-  Br/ Gen/ Rectal- Not done, not indicated Extrem- cyanosis- none, clubbing, none, atrophy- none, strength- nl Neuro- grossly intact to observation

## 2022-03-29 NOTE — Assessment & Plan Note (Signed)
No longer does shift work.  This problem can be resolved.

## 2022-03-29 NOTE — Patient Instructions (Signed)
Ok to continue current meds. Hope you can gradually reduce usage.  Please call when we can help

## 2022-04-03 ENCOUNTER — Other Ambulatory Visit (HOSPITAL_COMMUNITY): Payer: Self-pay

## 2022-04-04 ENCOUNTER — Other Ambulatory Visit (HOSPITAL_COMMUNITY): Payer: Self-pay

## 2022-04-04 MED ORDER — ZOLPIDEM TARTRATE 10 MG PO TABS
10.0000 mg | ORAL_TABLET | Freq: Every day | ORAL | 0 refills | Status: DC
  Filled 2022-04-04: qty 90, 90d supply, fill #0

## 2022-04-12 ENCOUNTER — Other Ambulatory Visit (HOSPITAL_COMMUNITY): Payer: Self-pay

## 2022-04-12 ENCOUNTER — Other Ambulatory Visit: Payer: Self-pay

## 2022-04-12 MED ORDER — RIZATRIPTAN BENZOATE 10 MG PO TABS
ORAL_TABLET | ORAL | 1 refills | Status: DC
  Filled 2022-04-12: qty 18, 30d supply, fill #0
  Filled 2022-07-24: qty 18, 30d supply, fill #1

## 2022-04-13 ENCOUNTER — Other Ambulatory Visit (HOSPITAL_COMMUNITY): Payer: Self-pay

## 2022-04-20 ENCOUNTER — Ambulatory Visit: Payer: No Typology Code available for payment source | Admitting: Internal Medicine

## 2022-04-21 ENCOUNTER — Ambulatory Visit: Payer: No Typology Code available for payment source | Admitting: Internal Medicine

## 2022-05-07 ENCOUNTER — Other Ambulatory Visit (HOSPITAL_COMMUNITY): Payer: Self-pay

## 2022-05-18 ENCOUNTER — Other Ambulatory Visit (HOSPITAL_COMMUNITY): Payer: Self-pay

## 2022-05-18 MED ORDER — AMPHETAMINE-DEXTROAMPHETAMINE 10 MG PO TABS
10.0000 mg | ORAL_TABLET | Freq: Every day | ORAL | 0 refills | Status: DC
  Filled 2022-05-18: qty 30, 30d supply, fill #0

## 2022-06-06 ENCOUNTER — Other Ambulatory Visit (HOSPITAL_COMMUNITY): Payer: Self-pay

## 2022-06-07 ENCOUNTER — Other Ambulatory Visit (HOSPITAL_COMMUNITY): Payer: Self-pay

## 2022-06-07 MED ORDER — CLONAZEPAM 1 MG PO TABS
1.0000 mg | ORAL_TABLET | Freq: Three times a day (TID) | ORAL | 3 refills | Status: DC
  Filled 2022-06-07: qty 90, 30d supply, fill #0
  Filled 2022-07-24: qty 90, 30d supply, fill #1
  Filled 2022-09-06: qty 90, 30d supply, fill #2
  Filled 2022-10-08: qty 90, 30d supply, fill #3

## 2022-06-08 ENCOUNTER — Other Ambulatory Visit (HOSPITAL_COMMUNITY): Payer: Self-pay

## 2022-06-27 ENCOUNTER — Other Ambulatory Visit (HOSPITAL_COMMUNITY): Payer: Self-pay

## 2022-06-28 ENCOUNTER — Other Ambulatory Visit (HOSPITAL_COMMUNITY): Payer: Self-pay

## 2022-06-28 MED ORDER — AMPHETAMINE-DEXTROAMPHETAMINE 10 MG PO TABS
10.0000 mg | ORAL_TABLET | Freq: Every day | ORAL | 0 refills | Status: DC
  Filled 2022-06-28: qty 30, 30d supply, fill #0

## 2022-07-03 ENCOUNTER — Other Ambulatory Visit (HOSPITAL_COMMUNITY): Payer: Self-pay

## 2022-07-04 ENCOUNTER — Other Ambulatory Visit (HOSPITAL_COMMUNITY): Payer: Self-pay

## 2022-07-04 MED ORDER — FINASTERIDE 5 MG PO TABS
5.0000 mg | ORAL_TABLET | Freq: Every day | ORAL | 0 refills | Status: DC
  Filled 2022-07-04: qty 30, 30d supply, fill #0

## 2022-07-24 ENCOUNTER — Other Ambulatory Visit (HOSPITAL_COMMUNITY): Payer: Self-pay

## 2022-07-25 ENCOUNTER — Other Ambulatory Visit (HOSPITAL_COMMUNITY): Payer: Self-pay

## 2022-07-25 MED ORDER — ZOLPIDEM TARTRATE 10 MG PO TABS
10.0000 mg | ORAL_TABLET | Freq: Every day | ORAL | 0 refills | Status: DC
  Filled 2022-07-25: qty 30, 30d supply, fill #0
  Filled 2022-10-08: qty 30, 30d supply, fill #1
  Filled 2022-12-02: qty 30, 30d supply, fill #2

## 2022-07-26 ENCOUNTER — Other Ambulatory Visit (HOSPITAL_COMMUNITY): Payer: Self-pay

## 2022-08-04 ENCOUNTER — Other Ambulatory Visit (HOSPITAL_COMMUNITY): Payer: Self-pay

## 2022-08-04 MED ORDER — AMPHETAMINE-DEXTROAMPHETAMINE 10 MG PO TABS
10.0000 mg | ORAL_TABLET | Freq: Every day | ORAL | 0 refills | Status: DC
  Filled 2022-08-04: qty 30, 30d supply, fill #0

## 2022-08-07 ENCOUNTER — Other Ambulatory Visit (HOSPITAL_COMMUNITY): Payer: Self-pay

## 2022-08-08 ENCOUNTER — Other Ambulatory Visit (HOSPITAL_COMMUNITY): Payer: Self-pay

## 2022-08-08 MED ORDER — FINASTERIDE 5 MG PO TABS
5.0000 mg | ORAL_TABLET | Freq: Every day | ORAL | 0 refills | Status: DC
  Filled 2022-08-08: qty 30, 30d supply, fill #0

## 2022-08-25 ENCOUNTER — Other Ambulatory Visit (HOSPITAL_COMMUNITY): Payer: Self-pay

## 2022-08-25 MED ORDER — MINOXIDIL 2.5 MG PO TABS
1.2500 mg | ORAL_TABLET | Freq: Every day | ORAL | 3 refills | Status: DC
  Filled 2022-08-25: qty 15, 30d supply, fill #0
  Filled 2022-09-20: qty 15, 30d supply, fill #1
  Filled 2022-10-10 – 2022-10-11 (×2): qty 15, 30d supply, fill #2

## 2022-08-26 ENCOUNTER — Other Ambulatory Visit (HOSPITAL_COMMUNITY): Payer: Self-pay

## 2022-09-06 ENCOUNTER — Other Ambulatory Visit (HOSPITAL_COMMUNITY): Payer: Self-pay

## 2022-09-06 ENCOUNTER — Other Ambulatory Visit: Payer: Self-pay

## 2022-09-07 ENCOUNTER — Other Ambulatory Visit: Payer: Self-pay

## 2022-09-07 ENCOUNTER — Other Ambulatory Visit (HOSPITAL_COMMUNITY): Payer: Self-pay

## 2022-09-07 MED ORDER — RIZATRIPTAN BENZOATE 10 MG PO TABS
10.0000 mg | ORAL_TABLET | ORAL | 1 refills | Status: DC
  Filled 2022-09-07: qty 18, 30d supply, fill #0
  Filled 2022-12-02: qty 18, 30d supply, fill #1

## 2022-09-07 MED ORDER — AMPHETAMINE-DEXTROAMPHETAMINE 10 MG PO TABS
10.0000 mg | ORAL_TABLET | Freq: Every day | ORAL | 0 refills | Status: DC
  Filled 2022-09-07: qty 30, 30d supply, fill #0

## 2022-09-08 ENCOUNTER — Other Ambulatory Visit (HOSPITAL_COMMUNITY): Payer: Self-pay

## 2022-09-09 ENCOUNTER — Other Ambulatory Visit (HOSPITAL_COMMUNITY): Payer: Self-pay

## 2022-09-12 ENCOUNTER — Other Ambulatory Visit (HOSPITAL_COMMUNITY): Payer: Self-pay

## 2022-09-13 ENCOUNTER — Other Ambulatory Visit (HOSPITAL_COMMUNITY): Payer: Self-pay

## 2022-09-13 MED ORDER — FINASTERIDE 5 MG PO TABS
5.0000 mg | ORAL_TABLET | Freq: Every day | ORAL | 1 refills | Status: DC
  Filled 2022-09-13: qty 30, 30d supply, fill #0
  Filled 2022-10-08: qty 30, 30d supply, fill #1

## 2022-09-20 ENCOUNTER — Other Ambulatory Visit (HOSPITAL_COMMUNITY): Payer: Self-pay

## 2022-10-10 ENCOUNTER — Other Ambulatory Visit (HOSPITAL_COMMUNITY): Payer: Self-pay

## 2022-10-10 ENCOUNTER — Other Ambulatory Visit: Payer: Self-pay

## 2022-10-11 ENCOUNTER — Other Ambulatory Visit (HOSPITAL_COMMUNITY): Payer: Self-pay

## 2022-10-11 MED ORDER — MINOXIDIL 2.5 MG PO TABS
2.5000 mg | ORAL_TABLET | Freq: Every day | ORAL | 1 refills | Status: DC
  Filled 2022-10-11 – 2022-10-26 (×2): qty 30, 30d supply, fill #0
  Filled 2022-12-02: qty 30, 30d supply, fill #1

## 2022-10-14 ENCOUNTER — Other Ambulatory Visit (HOSPITAL_COMMUNITY): Payer: Self-pay

## 2022-10-14 MED ORDER — AMPHETAMINE-DEXTROAMPHETAMINE 10 MG PO TABS
10.0000 mg | ORAL_TABLET | Freq: Every day | ORAL | 0 refills | Status: DC
  Filled 2022-10-14: qty 30, 30d supply, fill #0

## 2022-10-26 ENCOUNTER — Other Ambulatory Visit (HOSPITAL_COMMUNITY): Payer: Self-pay

## 2022-10-31 ENCOUNTER — Other Ambulatory Visit (HOSPITAL_COMMUNITY): Payer: Self-pay

## 2022-10-31 MED ORDER — FINASTERIDE 5 MG PO TABS
5.0000 mg | ORAL_TABLET | Freq: Every day | ORAL | 11 refills | Status: DC
  Filled 2022-10-31 – 2023-01-10 (×2): qty 30, 30d supply, fill #0
  Filled 2023-04-09: qty 30, 30d supply, fill #1
  Filled 2023-05-09: qty 30, 30d supply, fill #2
  Filled 2023-06-11: qty 30, 30d supply, fill #3
  Filled 2023-07-08: qty 30, 30d supply, fill #4
  Filled 2023-08-06: qty 30, 30d supply, fill #5
  Filled 2023-09-03: qty 30, 30d supply, fill #6
  Filled 2023-10-08: qty 30, 30d supply, fill #7

## 2022-11-30 ENCOUNTER — Other Ambulatory Visit (HOSPITAL_COMMUNITY): Payer: Self-pay

## 2022-12-01 ENCOUNTER — Other Ambulatory Visit (HOSPITAL_COMMUNITY): Payer: Self-pay

## 2022-12-01 MED ORDER — CLONAZEPAM 1 MG PO TABS
1.0000 mg | ORAL_TABLET | Freq: Three times a day (TID) | ORAL | 3 refills | Status: DC
  Filled 2022-12-01: qty 90, 30d supply, fill #0
  Filled 2023-01-10: qty 90, 30d supply, fill #1
  Filled 2023-02-26: qty 90, 30d supply, fill #2
  Filled 2023-04-05: qty 90, 30d supply, fill #3

## 2022-12-02 ENCOUNTER — Other Ambulatory Visit (HOSPITAL_COMMUNITY): Payer: Self-pay

## 2022-12-12 ENCOUNTER — Other Ambulatory Visit (HOSPITAL_COMMUNITY): Payer: Self-pay

## 2022-12-12 MED ORDER — AMPHETAMINE-DEXTROAMPHETAMINE 10 MG PO TABS
10.0000 mg | ORAL_TABLET | Freq: Every day | ORAL | 0 refills | Status: DC
  Filled 2022-12-12: qty 30, 30d supply, fill #0

## 2022-12-27 ENCOUNTER — Other Ambulatory Visit (HOSPITAL_COMMUNITY): Payer: Self-pay

## 2022-12-27 ENCOUNTER — Other Ambulatory Visit: Payer: Self-pay

## 2022-12-27 MED ORDER — PHENTERMINE HCL 37.5 MG PO TABS
ORAL_TABLET | ORAL | 3 refills | Status: DC
  Filled 2022-12-27: qty 30, 30d supply, fill #0
  Filled 2023-01-25: qty 30, 30d supply, fill #1

## 2022-12-28 ENCOUNTER — Other Ambulatory Visit (HOSPITAL_COMMUNITY): Payer: Self-pay

## 2022-12-29 ENCOUNTER — Other Ambulatory Visit (HOSPITAL_COMMUNITY): Payer: Self-pay

## 2022-12-29 MED ORDER — NITROFURANTOIN MONOHYD MACRO 100 MG PO CAPS
ORAL_CAPSULE | ORAL | 0 refills | Status: DC
  Filled 2022-12-29: qty 14, 7d supply, fill #0

## 2023-01-10 ENCOUNTER — Other Ambulatory Visit (HOSPITAL_COMMUNITY): Payer: Self-pay

## 2023-01-11 ENCOUNTER — Other Ambulatory Visit: Payer: Self-pay

## 2023-01-11 ENCOUNTER — Other Ambulatory Visit (HOSPITAL_COMMUNITY): Payer: Self-pay

## 2023-01-11 MED ORDER — ZOLPIDEM TARTRATE 10 MG PO TABS
10.0000 mg | ORAL_TABLET | Freq: Every day | ORAL | 3 refills | Status: DC
  Filled 2023-01-11: qty 30, 30d supply, fill #0
  Filled 2023-02-05 – 2023-02-07 (×2): qty 30, 30d supply, fill #1
  Filled 2023-03-06: qty 30, 30d supply, fill #2
  Filled 2023-04-03: qty 30, 30d supply, fill #3

## 2023-01-11 MED ORDER — MINOXIDIL 2.5 MG PO TABS
2.5000 mg | ORAL_TABLET | Freq: Every day | ORAL | 1 refills | Status: DC
  Filled 2023-01-11: qty 30, 30d supply, fill #0
  Filled 2023-02-22: qty 30, 30d supply, fill #1

## 2023-01-13 ENCOUNTER — Other Ambulatory Visit (HOSPITAL_COMMUNITY): Payer: Self-pay

## 2023-02-06 ENCOUNTER — Other Ambulatory Visit: Payer: Self-pay

## 2023-02-07 ENCOUNTER — Other Ambulatory Visit (HOSPITAL_COMMUNITY): Payer: Self-pay

## 2023-02-08 ENCOUNTER — Other Ambulatory Visit (HOSPITAL_COMMUNITY): Payer: Self-pay

## 2023-02-08 MED ORDER — AMPHETAMINE-DEXTROAMPHETAMINE 15 MG PO TABS
15.0000 mg | ORAL_TABLET | Freq: Every day | ORAL | 0 refills | Status: DC
  Filled 2023-02-08: qty 30, 30d supply, fill #0

## 2023-02-09 ENCOUNTER — Other Ambulatory Visit (HOSPITAL_COMMUNITY): Payer: Self-pay

## 2023-02-09 ENCOUNTER — Other Ambulatory Visit (HOSPITAL_BASED_OUTPATIENT_CLINIC_OR_DEPARTMENT_OTHER): Payer: Self-pay

## 2023-02-10 ENCOUNTER — Other Ambulatory Visit (HOSPITAL_COMMUNITY): Payer: Self-pay

## 2023-02-11 ENCOUNTER — Other Ambulatory Visit (HOSPITAL_COMMUNITY): Payer: Self-pay

## 2023-02-27 ENCOUNTER — Other Ambulatory Visit: Payer: Self-pay

## 2023-03-07 ENCOUNTER — Other Ambulatory Visit (HOSPITAL_COMMUNITY): Payer: Self-pay

## 2023-03-16 ENCOUNTER — Other Ambulatory Visit (HOSPITAL_COMMUNITY): Payer: Self-pay

## 2023-03-16 MED ORDER — AMPHETAMINE-DEXTROAMPHETAMINE 15 MG PO TABS
1.0000 | ORAL_TABLET | Freq: Every day | ORAL | 0 refills | Status: DC
  Filled 2023-03-16: qty 30, 30d supply, fill #0

## 2023-03-30 ENCOUNTER — Other Ambulatory Visit (HOSPITAL_COMMUNITY): Payer: Self-pay

## 2023-03-31 ENCOUNTER — Other Ambulatory Visit (HOSPITAL_COMMUNITY): Payer: Self-pay

## 2023-03-31 MED ORDER — MINOXIDIL 2.5 MG PO TABS
2.5000 mg | ORAL_TABLET | Freq: Every day | ORAL | 1 refills | Status: DC
  Filled 2023-03-31: qty 30, 30d supply, fill #0
  Filled 2023-04-26: qty 30, 30d supply, fill #1

## 2023-04-01 ENCOUNTER — Other Ambulatory Visit (HOSPITAL_COMMUNITY): Payer: Self-pay

## 2023-04-04 ENCOUNTER — Other Ambulatory Visit: Payer: Self-pay

## 2023-04-06 ENCOUNTER — Other Ambulatory Visit: Payer: Self-pay

## 2023-04-17 ENCOUNTER — Other Ambulatory Visit: Payer: Self-pay

## 2023-04-17 ENCOUNTER — Other Ambulatory Visit (HOSPITAL_COMMUNITY): Payer: Self-pay

## 2023-04-17 MED ORDER — AMPHETAMINE-DEXTROAMPHETAMINE 15 MG PO TABS
1.0000 | ORAL_TABLET | Freq: Every day | ORAL | 0 refills | Status: DC
  Filled 2023-04-17: qty 30, 30d supply, fill #0

## 2023-04-19 ENCOUNTER — Other Ambulatory Visit (HOSPITAL_COMMUNITY): Payer: Self-pay

## 2023-04-26 ENCOUNTER — Other Ambulatory Visit (HOSPITAL_COMMUNITY): Payer: Self-pay

## 2023-04-27 ENCOUNTER — Other Ambulatory Visit (HOSPITAL_COMMUNITY): Payer: Self-pay

## 2023-04-27 MED ORDER — RIZATRIPTAN BENZOATE 10 MG PO TABS
ORAL_TABLET | ORAL | 1 refills | Status: DC
  Filled 2023-04-27: qty 18, 20d supply, fill #0
  Filled 2023-07-08: qty 18, 20d supply, fill #1

## 2023-05-03 ENCOUNTER — Other Ambulatory Visit (HOSPITAL_COMMUNITY): Payer: Self-pay

## 2023-05-03 MED ORDER — PROGESTERONE MICRONIZED 100 MG PO CAPS
100.0000 mg | ORAL_CAPSULE | Freq: Every day | ORAL | 0 refills | Status: DC
  Filled 2023-05-03: qty 30, 30d supply, fill #0

## 2023-05-04 ENCOUNTER — Other Ambulatory Visit (HOSPITAL_COMMUNITY): Payer: Self-pay

## 2023-05-09 ENCOUNTER — Other Ambulatory Visit (HOSPITAL_COMMUNITY): Payer: Self-pay

## 2023-05-10 ENCOUNTER — Other Ambulatory Visit (HOSPITAL_COMMUNITY): Payer: Self-pay

## 2023-05-10 MED ORDER — CLONAZEPAM 1 MG PO TABS
1.0000 mg | ORAL_TABLET | Freq: Three times a day (TID) | ORAL | 3 refills | Status: DC
  Filled 2023-05-10: qty 90, 30d supply, fill #0
  Filled 2023-06-09: qty 90, 30d supply, fill #1
  Filled 2023-07-08: qty 90, 30d supply, fill #2
  Filled 2023-08-06: qty 90, 30d supply, fill #3

## 2023-06-01 ENCOUNTER — Other Ambulatory Visit: Payer: Self-pay

## 2023-06-05 ENCOUNTER — Other Ambulatory Visit (HOSPITAL_COMMUNITY): Payer: Self-pay

## 2023-06-06 ENCOUNTER — Other Ambulatory Visit (HOSPITAL_COMMUNITY): Payer: Self-pay

## 2023-06-06 MED ORDER — ZOLPIDEM TARTRATE 10 MG PO TABS
10.0000 mg | ORAL_TABLET | Freq: Every day | ORAL | 3 refills | Status: DC
  Filled 2023-06-06: qty 30, 30d supply, fill #0
  Filled 2023-07-31: qty 30, 30d supply, fill #1
  Filled 2023-09-03: qty 30, 30d supply, fill #2
  Filled 2023-10-15: qty 30, 30d supply, fill #3

## 2023-06-06 MED ORDER — PROGESTERONE MICRONIZED 100 MG PO CAPS
100.0000 mg | ORAL_CAPSULE | Freq: Every day | ORAL | 2 refills | Status: DC
  Filled 2023-06-06: qty 30, 30d supply, fill #0

## 2023-06-07 ENCOUNTER — Other Ambulatory Visit (HOSPITAL_COMMUNITY): Payer: Self-pay

## 2023-06-11 ENCOUNTER — Other Ambulatory Visit (HOSPITAL_COMMUNITY): Payer: Self-pay

## 2023-06-12 ENCOUNTER — Other Ambulatory Visit: Payer: Self-pay

## 2023-06-12 ENCOUNTER — Other Ambulatory Visit (HOSPITAL_COMMUNITY): Payer: Self-pay

## 2023-06-12 MED ORDER — MINOXIDIL 2.5 MG PO TABS
2.5000 mg | ORAL_TABLET | Freq: Every day | ORAL | 1 refills | Status: DC
  Filled 2023-06-12: qty 30, 30d supply, fill #0
  Filled 2023-07-31: qty 30, 30d supply, fill #1

## 2023-06-22 ENCOUNTER — Other Ambulatory Visit (HOSPITAL_COMMUNITY): Payer: Self-pay

## 2023-06-26 ENCOUNTER — Other Ambulatory Visit (HOSPITAL_COMMUNITY): Payer: Self-pay

## 2023-06-26 MED ORDER — AMPHETAMINE-DEXTROAMPHETAMINE 15 MG PO TABS
1.0000 | ORAL_TABLET | Freq: Every day | ORAL | 0 refills | Status: DC
  Filled 2023-06-26: qty 30, 30d supply, fill #0

## 2023-06-27 ENCOUNTER — Other Ambulatory Visit (HOSPITAL_COMMUNITY): Payer: Self-pay

## 2023-07-05 ENCOUNTER — Other Ambulatory Visit (HOSPITAL_COMMUNITY): Payer: Self-pay

## 2023-07-05 MED ORDER — BUPROPION HCL ER (XL) 150 MG PO TB24
150.0000 mg | ORAL_TABLET | Freq: Every day | ORAL | 1 refills | Status: DC
Start: 2023-07-05 — End: 2024-02-14
  Filled 2023-07-05: qty 30, 30d supply, fill #0
  Filled 2023-07-31: qty 30, 30d supply, fill #1
  Filled 2023-09-03: qty 30, 30d supply, fill #2
  Filled 2023-10-06: qty 30, 30d supply, fill #3
  Filled 2023-12-04 (×2): qty 30, 30d supply, fill #4
  Filled 2024-01-15: qty 30, 30d supply, fill #5

## 2023-07-06 ENCOUNTER — Other Ambulatory Visit (HOSPITAL_COMMUNITY): Payer: Self-pay

## 2023-07-10 ENCOUNTER — Other Ambulatory Visit (HOSPITAL_COMMUNITY): Payer: Self-pay

## 2023-07-10 ENCOUNTER — Other Ambulatory Visit: Payer: Self-pay

## 2023-07-31 ENCOUNTER — Other Ambulatory Visit (HOSPITAL_COMMUNITY): Payer: Self-pay

## 2023-07-31 ENCOUNTER — Other Ambulatory Visit: Payer: Self-pay

## 2023-07-31 MED ORDER — ESTRADIOL 0.1 MG/GM VA CREA
TOPICAL_CREAM | VAGINAL | 3 refills | Status: AC
  Filled 2023-07-31 (×2): qty 42.5, 198d supply, fill #0
  Filled 2024-03-06: qty 42.5, 198d supply, fill #1

## 2023-07-31 MED ORDER — AMPHETAMINE-DEXTROAMPHETAMINE 15 MG PO TABS
1.0000 | ORAL_TABLET | Freq: Every day | ORAL | 0 refills | Status: DC
  Filled 2023-07-31: qty 30, 30d supply, fill #0

## 2023-07-31 MED ORDER — RIZATRIPTAN BENZOATE 10 MG PO TABS
10.0000 mg | ORAL_TABLET | Freq: Every day | ORAL | 1 refills | Status: DC | PRN
  Filled 2023-07-31: qty 18, 20d supply, fill #0
  Filled 2023-12-17: qty 18, 20d supply, fill #1

## 2023-08-02 ENCOUNTER — Other Ambulatory Visit (HOSPITAL_COMMUNITY): Payer: Self-pay

## 2023-08-07 ENCOUNTER — Other Ambulatory Visit: Payer: Self-pay

## 2023-08-08 ENCOUNTER — Other Ambulatory Visit (HOSPITAL_COMMUNITY): Payer: Self-pay

## 2023-09-03 ENCOUNTER — Other Ambulatory Visit (HOSPITAL_COMMUNITY): Payer: Self-pay

## 2023-09-04 ENCOUNTER — Other Ambulatory Visit (HOSPITAL_COMMUNITY): Payer: Self-pay

## 2023-09-04 ENCOUNTER — Other Ambulatory Visit: Payer: Self-pay

## 2023-09-04 MED ORDER — CLONAZEPAM 1 MG PO TABS
1.0000 mg | ORAL_TABLET | Freq: Three times a day (TID) | ORAL | 3 refills | Status: DC
  Filled 2023-09-04: qty 90, 30d supply, fill #0
  Filled 2023-10-05: qty 90, 30d supply, fill #1
  Filled 2023-11-05: qty 90, 30d supply, fill #2
  Filled 2023-12-04 (×2): qty 90, 30d supply, fill #3

## 2023-09-05 ENCOUNTER — Other Ambulatory Visit (HOSPITAL_COMMUNITY): Payer: Self-pay

## 2023-09-05 ENCOUNTER — Other Ambulatory Visit (HOSPITAL_BASED_OUTPATIENT_CLINIC_OR_DEPARTMENT_OTHER): Payer: Self-pay

## 2023-09-05 ENCOUNTER — Other Ambulatory Visit: Payer: Self-pay

## 2023-09-05 MED ORDER — MINOXIDIL 2.5 MG PO TABS
2.5000 mg | ORAL_TABLET | Freq: Every day | ORAL | 0 refills | Status: DC
  Filled 2023-09-05: qty 30, 30d supply, fill #0

## 2023-09-13 ENCOUNTER — Other Ambulatory Visit (HOSPITAL_COMMUNITY): Payer: Self-pay

## 2023-09-13 MED ORDER — AMPHETAMINE-DEXTROAMPHETAMINE 15 MG PO TABS
1.0000 | ORAL_TABLET | Freq: Every day | ORAL | 0 refills | Status: DC
  Filled 2023-09-13: qty 30, 30d supply, fill #0

## 2023-09-18 ENCOUNTER — Other Ambulatory Visit (HOSPITAL_COMMUNITY): Payer: Self-pay

## 2023-09-18 MED ORDER — FINASTERIDE 5 MG PO TABS
5.0000 mg | ORAL_TABLET | Freq: Every day | ORAL | 3 refills | Status: AC
  Filled 2023-09-18: qty 90, 90d supply, fill #0
  Filled 2023-11-09: qty 30, 30d supply, fill #0
  Filled 2023-12-04 (×2): qty 30, 30d supply, fill #1
  Filled 2024-01-11: qty 30, 30d supply, fill #2
  Filled 2024-02-05: qty 30, 30d supply, fill #3
  Filled 2024-03-06: qty 30, 30d supply, fill #4
  Filled 2024-04-03: qty 30, 30d supply, fill #5
  Filled 2024-05-06: qty 30, 30d supply, fill #6
  Filled 2024-06-06: qty 30, 30d supply, fill #7
  Filled 2024-07-07: qty 30, 30d supply, fill #8
  Filled 2024-08-04: qty 30, 30d supply, fill #9
  Filled 2024-09-02: qty 30, 30d supply, fill #10

## 2023-09-18 MED ORDER — SPIRONOLACTONE 50 MG PO TABS
50.0000 mg | ORAL_TABLET | Freq: Every day | ORAL | 3 refills | Status: AC
  Filled 2023-09-18: qty 30, 30d supply, fill #0
  Filled 2023-10-15: qty 30, 30d supply, fill #1
  Filled 2023-11-14: qty 30, 30d supply, fill #2
  Filled 2023-12-13: qty 30, 30d supply, fill #3
  Filled 2024-01-22: qty 30, 30d supply, fill #4
  Filled 2024-02-27: qty 30, 30d supply, fill #5
  Filled 2024-03-29: qty 30, 30d supply, fill #6
  Filled 2024-04-23: qty 30, 30d supply, fill #7
  Filled 2024-05-26: qty 30, 30d supply, fill #8
  Filled 2024-07-03: qty 30, 30d supply, fill #9
  Filled 2024-08-04: qty 30, 30d supply, fill #10
  Filled 2024-09-02: qty 30, 30d supply, fill #11

## 2023-09-18 MED ORDER — MINOXIDIL 2.5 MG PO TABS
2.5000 mg | ORAL_TABLET | Freq: Every day | ORAL | 0 refills | Status: DC
  Filled 2023-09-18 – 2023-10-08 (×2): qty 30, 30d supply, fill #0

## 2023-10-06 ENCOUNTER — Other Ambulatory Visit: Payer: Self-pay

## 2023-10-09 ENCOUNTER — Other Ambulatory Visit (HOSPITAL_COMMUNITY): Payer: Self-pay

## 2023-10-16 ENCOUNTER — Other Ambulatory Visit: Payer: Self-pay

## 2023-10-17 ENCOUNTER — Other Ambulatory Visit (HOSPITAL_COMMUNITY): Payer: Self-pay

## 2023-10-23 ENCOUNTER — Other Ambulatory Visit (HOSPITAL_COMMUNITY): Payer: Self-pay

## 2023-10-23 MED ORDER — AMPHETAMINE-DEXTROAMPHETAMINE 15 MG PO TABS
1.0000 | ORAL_TABLET | Freq: Every day | ORAL | 0 refills | Status: DC
  Filled 2023-10-23: qty 30, 30d supply, fill #0

## 2023-11-06 ENCOUNTER — Other Ambulatory Visit: Payer: Self-pay

## 2023-11-07 ENCOUNTER — Other Ambulatory Visit (HOSPITAL_COMMUNITY): Payer: Self-pay

## 2023-11-09 ENCOUNTER — Other Ambulatory Visit (HOSPITAL_COMMUNITY): Payer: Self-pay

## 2023-11-16 ENCOUNTER — Telehealth: Payer: Self-pay

## 2023-11-16 NOTE — Telephone Encounter (Signed)
 Pt has been scheduled for 4/2

## 2023-11-16 NOTE — Telephone Encounter (Signed)
 Copied from CRM [id number]. Topic: General - Other >> Nov 16, 2023  1:03 PM Isabelle Course C wrote: Reason for CRM: patient stated that she is having cosmetic surgery and needed labs and EKG. Informed patient that Dr. Laury Axon Surgical Clearance and orders before scheduling. Patient will be faxing paperwork today. Looking to come in tomorrow 3/28

## 2023-11-16 NOTE — Telephone Encounter (Signed)
 Patient needs an appointment for surgical clearance  We received form to sign off prior to Surg

## 2023-11-16 NOTE — Telephone Encounter (Signed)
 Pt has been scheduled.

## 2023-11-22 ENCOUNTER — Other Ambulatory Visit (INDEPENDENT_AMBULATORY_CARE_PROVIDER_SITE_OTHER)

## 2023-11-22 ENCOUNTER — Ambulatory Visit: Admitting: Family Medicine

## 2023-11-22 ENCOUNTER — Other Ambulatory Visit: Payer: Self-pay

## 2023-11-22 ENCOUNTER — Encounter: Payer: Self-pay | Admitting: Family Medicine

## 2023-11-22 VITALS — BP 118/76 | HR 78 | Temp 98.0°F | Ht 63.5 in | Wt 141.6 lb

## 2023-11-22 DIAGNOSIS — L03114 Cellulitis of left upper limb: Secondary | ICD-10-CM | POA: Diagnosis not present

## 2023-11-22 DIAGNOSIS — Z01818 Encounter for other preprocedural examination: Secondary | ICD-10-CM

## 2023-11-22 LAB — CBC WITH DIFFERENTIAL/PLATELET
Basophils Absolute: 0 10*3/uL (ref 0.0–0.1)
Basophils Relative: 0.8 % (ref 0.0–3.0)
Eosinophils Absolute: 0.1 10*3/uL (ref 0.0–0.7)
Eosinophils Relative: 1.4 % (ref 0.0–5.0)
HCT: 45.2 % (ref 36.0–46.0)
Hemoglobin: 15.3 g/dL — ABNORMAL HIGH (ref 12.0–15.0)
Lymphocytes Relative: 33.8 % (ref 12.0–46.0)
Lymphs Abs: 1.7 10*3/uL (ref 0.7–4.0)
MCHC: 33.7 g/dL (ref 30.0–36.0)
MCV: 94.8 fl (ref 78.0–100.0)
Monocytes Absolute: 0.5 10*3/uL (ref 0.1–1.0)
Monocytes Relative: 9.1 % (ref 3.0–12.0)
Neutro Abs: 2.8 10*3/uL (ref 1.4–7.7)
Neutrophils Relative %: 54.9 % (ref 43.0–77.0)
Platelets: 354 10*3/uL (ref 150.0–400.0)
RBC: 4.77 Mil/uL (ref 3.87–5.11)
RDW: 12.6 % (ref 11.5–15.5)
WBC: 5 10*3/uL (ref 4.0–10.5)

## 2023-11-22 LAB — PROTIME-INR
INR: 1.1 ratio — ABNORMAL HIGH (ref 0.8–1.0)
INR: 1.1 ratio — ABNORMAL HIGH (ref 0.8–1.0)
Prothrombin Time: 11.4 s (ref 9.6–13.1)
Prothrombin Time: 11.2 s (ref 9.6–13.1)

## 2023-11-22 LAB — HEMOGLOBIN A1C: Hgb A1c MFr Bld: 5.7 % (ref 4.6–6.5)

## 2023-11-22 LAB — LAB REPORT - SCANNED: A1c: 5.7

## 2023-11-22 LAB — APTT: aPTT: 33.6 s (ref 25.4–36.8)

## 2023-11-22 MED ORDER — CLINDAMYCIN HCL 300 MG PO CAPS: 300.0000 mg | ORAL_CAPSULE | Freq: Three times a day (TID) | ORAL | 0 refills | Status: AC

## 2023-11-22 NOTE — Patient Instructions (Signed)
 Thanks for coming in today.  Start clindamycin for suspected cellulitis, glad that his start to improve but with your history of severe cellulitis I think it is reasonable to restart clindamycin, 1 week of antibiotic prescribed.  Unfortunately our side effects are missed without medicine, watch for any diarrhea, especially more than a few episodes per day and let me know if that occurs.  If any worsening redness or other new symptoms including return of fever, be seen.  I do not expect that to occur.  I will complete the paperwork for your surgeon once I review labs and we will send a copy of labs and EKG to your surgeon.  Good luck!

## 2023-11-22 NOTE — Progress Notes (Signed)
 Subjective:  Patient ID: Jodi Nunez., female    DOB: 08/09/1961  Age: 63 y.o. MRN: [id number]  CC:  Chief Complaint  Patient presents with   Medical Clearance    Pt needs EKG today for her medical clearance    Cellulitis    Pt reports redness and soreness in Lt upper shoulder and now her arm is involved she is requesting abx     HPI [name] presents for   2 concerns as above, medical/preop clearance as well as possible cellulitis.  Last visit with me in July 2022. Followed by GYN.  Working for [employer] past 2 years. Dexa, mammography. Doing better.   Left arm cellulitis/redness: History of previous cellulitis, prior lymph node dissection with breast cancer treatment in 2001.  She was treated for cellulitis with clindamycin in 2022 with some chills night prior. Prior hospitalization multiple times for cellulitis.  Current episode started mid last week - sore in muscles all over. Visiting son at [facility] last week, fever of 100.5-101 on Sunday  (3 days ago)- only had fever 1 day. Only other sx of fatigue, body aches. No HA, cough, sire throat.  Fever resolved after nap. Left arm was sore, swollen, red. Treated at home with lymph massage and wrap, compression yesterday. Still arm to touch - redness improved. Still faint pink area on arm.  No side effects with clindamycin prior.  Preoperative evaluation/clearance: Paperwork reviewed from [facility] cosmetic surgery, Dr. Antonieta Pert.  Requesting EKG, CBC, CMP, A1c, PT/INR and PTT. Planned procedure of brow lift, lower and possible upper lid surgery, injections around mouth.  No difficulty with anesthesia in the past. 4 years ago. Slight euphoria with anesthesia. No complications.  No CP/dyspnea with exertion. No difficulty with multiple flights of stairs.  No hx of OSA. No hx of CVA, CAD, CKD, or diabetes.   Lab Results  Component Value Date   CREATININE 0.82 06/29/2020   Lab Results  Component Value Date   WBC 8.8 11/03/2019    HGB 12.0 11/03/2019   HCT 36.2 11/03/2019   MCV 97.3 11/03/2019   PLT 301 11/03/2019      History Patient Active Problem List   Diagnosis Date Noted   Migraines 06/26/2017   Cellulitis of left arm 06/25/2017   Insomnia 01/29/2017   Past Medical History:  Diagnosis Date   Allergy    Breast cancer (HCC) 2006   ER-/PR-/her2+   Menopausal and perimenopausal disorder    Migraines    Past Surgical History:  Procedure Laterality Date   BREAST SURGERY     CESAREAN SECTION     lymph node resection     MASTECTOMY     Allergies  Allergen Reactions   Penicillins Anaphylaxis    Has patient had a PCN reaction causing immediate rash, facial/tongue/throat swelling, SOB or lightheadedness with hypotension: Yes Has patient had a PCN reaction causing severe rash involving mucus membranes or skin necrosis: No Has patient had a PCN reaction that required hospitalization No Has patient had a PCN reaction occurring within the last 10 years: Yes If all of the above answers are "NO", then may proceed with Cephalosporin use.    Prior to Admission medications   Medication Sig Start Date End Date Taking? Authorizing Provider  amphetamine-dextroamphetamine (ADDERALL) 15 MG tablet Take 1 tablet by mouth daily. 10/23/23  Yes   Ascorbic Acid (VITAMIN C PO) Take 1 tablet by mouth daily.   Yes [provider]  b complex vitamins  tablet Take 1 tablet by mouth daily.   Yes [provider]  buPROPion (WELLBUTRIN XL) 150 MG 24 hr tablet Take 1 tablet (150 mg total) by mouth daily. 07/05/23  Yes   CALCIUM PO Take 2 tablets by mouth 2 (two) times daily.    Yes [provider]  Cholecalciferol (VITAMIN D PO) Take 1 tablet by mouth daily.    Yes [provider]  clonazePAM (KLONOPIN) 1 MG tablet Take 1 tablet (1 mg total) by mouth 3 (three) times daily. 09/04/23  Yes   estradiol (ESTRACE) 0.1 MG/GM vaginal cream Insert 1/2 gram vaginally 3 times a week as directed. 07/31/23   Yes   eszopiclone (LUNESTA) 2 MG TABS tablet Take 1 tablet by mouth once daily. 06/03/21  Yes   finasteride (PROSCAR) 5 MG tablet Take 1 tablet (5 mg total) by mouth daily. 10/31/22  Yes   finasteride (PROSCAR) 5 MG tablet Take 1 tablet (5 mg total) by mouth daily. 09/18/23  Yes   ibuprofen (ADVIL,MOTRIN) 200 MG tablet Take 600 mg by mouth every 6 (six) hours as needed for moderate pain.   Yes [provider]  minoxidil (LONITEN) 2.5 MG tablet Take 1 tablet (2.5 mg total) by mouth daily. 09/18/23  Yes   Multiple Vitamin (MULITIVITAMIN WITH MINERALS) TABS Take 1 tablet by mouth daily.   Yes [provider]  rizatriptan (MAXALT) 10 MG tablet Take 1 tablet (10 mg total) by mouth daily as needed for migraine headache. (Do not exceed 3 tablets in 24 hours.) 07/31/23  Yes   spironolactone (ALDACTONE) 50 MG tablet Take 1 tablet (50 mg total) by mouth daily. 09/18/23  Yes   zolpidem (AMBIEN) 10 MG tablet Take 1 tablet (10 mg total) by mouth at bedtime. 06/06/23  Yes    Social History   Socioeconomic History   Marital status: Married    Spouse name: Not on file   Number of children: Not on file   Years of education: Not on file   Highest education level: Bachelor's degree (e.g., BA, AB, BS)  Occupational History   Not on file  Tobacco Use   Smoking status: Former    Current packs/day: 0.00    Average packs/day: 0.2 packs/day for 10.0 years (2.0 ttl pk-yrs)    Types: Cigarettes    Start date: 01/26/1987    Quit date: 01/25/1997    Years since quitting: 26.8   Smokeless tobacco: Never  Substance and Sexual Activity   Alcohol use: Yes    Comment: occasional   Drug use: No   Sexual activity: Not on file  Other Topics Concern   Not on file  Social History Narrative   Not on file   Social Drivers of Health   Financial Resource Strain: Low Risk  (11/19/2023)   Overall Financial Resource Strain (CARDIA)    Difficulty of Paying Living Expenses: Not hard at all  Food Insecurity: No  Food Insecurity (11/19/2023)   Hunger Vital Sign    Worried About Running Out of Food in the Last Year: Never true    Ran Out of Food in the Last Year: Never true  Transportation Needs: No Transportation Needs (11/19/2023)   PRAPARE - Administrator, Civil Service (Medical): No    Lack of Transportation (Non-Medical): No  Physical Activity: Sufficiently Active (11/19/2023)   Exercise Vital Sign    Days of Exercise per Week: 4 days    Minutes of Exercise per Session: 60 min  Stress:  Stress Concern Present (11/19/2023)   [employer] of Occupational Health - Occupational Stress Questionnaire    Feeling of Stress : To some extent  Social Connections: Unknown (11/19/2023)   Social Connection and Isolation Panel [NHANES]    Frequency of Communication with Friends and Family: Three times a week    Frequency of Social Gatherings with Friends and Family: Once a week    Attends Religious Services: Patient declined    Database administrator or Organizations: Patient declined    Attends Banker Meetings: Not on file    Marital Status: Married  Catering manager Violence: Not on file    Review of Systems Per HPI.   Objective:   Vitals:   11/22/23 0950  BP: 118/76  Pulse: 78  Temp: 98 F (36.7 C)  TempSrc: Temporal  SpO2: 98%  Weight: 141 lb 9.6 oz (64.2 kg)  Height: 5' 3.5" (1.613 m)     Physical Exam Vitals reviewed.  Constitutional:      Appearance: Normal appearance. She is well-developed.  HENT:     Head: Normocephalic and atraumatic.  Eyes:     Conjunctiva/sclera: Conjunctivae normal.     Pupils: Pupils are equal, round, and reactive to light.  Neck:     Vascular: No carotid bruit.  Cardiovascular:     Rate and Rhythm: Normal rate and regular rhythm.     Heart sounds: Normal heart sounds.  Pulmonary:     Effort: Pulmonary effort is normal.     Breath sounds: Normal breath sounds.  Abdominal:     Palpations: Abdomen is soft. There is no  pulsatile mass.     Tenderness: There is no abdominal tenderness.  Musculoskeletal:     Right lower leg: No edema.     Left lower leg: No edema.  Skin:    General: Skin is warm and dry.     Findings: Erythema (Left arm, faint areas of erythema, see photo.  Warmth of upper arm towards elbow.) present.  Neurological:     Mental Status: She is alert and oriented to person, place, and time.  Psychiatric:        Mood and Affect: Mood normal.        Behavior: Behavior normal.        EKG: nsr, no acute findings, no apparent changes from 11/14/19 comparison EKG.  Assessment & Plan:  Talishia Betzler. is a 63 y.o. female . Left arm cellulitis - Plan: clindamycin (CLEOCIN) 300 MG capsule  -Slight improvement with home treatment, but she has experienced significant cellulitis requiring hospitalization in the past.  Will treat with clindamycin, 7-day course, potential side effects and risks discussed with RTC precautions given.  Preoperative evaluation to rule out surgical contraindication - Plan: EKG 12-Lead, Comprehensive metabolic panel with GFR, CBC with Differential/Platelet, Protime-INR, PTT, Hemoglobin A1c  -Appears to be at acceptable risk for planned procedure.  No acute findings on EKG, low risk of major adverse cardiac event, check labs and then complete paperwork for surgeon  Meds ordered this encounter  Medications   clindamycin (CLEOCIN) 300 MG capsule    Sig: Take 1 capsule (300 mg total) by mouth 3 (three) times daily for 7 days.    Dispense:  21 capsule    Refill:  0   Patient Instructions  Thanks for coming in today.  Start clindamycin for suspected cellulitis, glad that his start to improve but with your history of severe cellulitis I think it is reasonable to restart  clindamycin, 1 week of antibiotic prescribed.  Unfortunately our side effects are missed without medicine, watch for any diarrhea, especially more than a few episodes per day and let me know if that occurs.  If  any worsening redness or other new symptoms including return of fever, be seen.  I do not expect that to occur.  I will complete the paperwork for your surgeon once I review labs and we will send a copy of labs and EKG to your surgeon.  Good luck!    Signed,   Meredith Staggers, MD [facility], [facility] 11/22/23 11:04 AM

## 2023-11-23 LAB — COMPLETE METABOLIC PANEL WITHOUT GFR
AG Ratio: 2 (calc) (ref 1.0–2.5)
ALT: 17 U/L (ref 6–29)
AST: 21 U/L (ref 10–35)
Albumin: 5 g/dL (ref 3.6–5.1)
Alkaline phosphatase (APISO): 63 U/L (ref 37–153)
BUN/Creatinine Ratio: 30 (calc) — ABNORMAL HIGH (ref 6–22)
BUN: 27 mg/dL — ABNORMAL HIGH (ref 7–25)
CO2: 24 mmol/L (ref 20–32)
Calcium: 10 mg/dL (ref 8.6–10.4)
Chloride: 101 mmol/L (ref 98–110)
Creat: 0.89 mg/dL (ref 0.50–1.05)
Globulin: 2.5 g/dL (ref 1.9–3.7)
Glucose, Bld: 100 mg/dL — ABNORMAL HIGH (ref 65–99)
Potassium: 4.9 mmol/L (ref 3.5–5.3)
Sodium: 139 mmol/L (ref 135–146)
Total Bilirubin: 0.4 mg/dL (ref 0.2–1.2)
Total Protein: 7.5 g/dL (ref 6.1–8.1)

## 2023-11-24 ENCOUNTER — Encounter: Payer: Self-pay | Admitting: Family Medicine

## 2023-11-24 NOTE — Telephone Encounter (Signed)
 Lab results sent to patient, but please send the paperwork to cosmetic surgeon along with a copy of her last office visit, and copy of the labs.Paperwork completed and placed in fax bin at back nurse station

## 2023-11-24 NOTE — Telephone Encounter (Signed)
 Faxed

## 2023-12-04 ENCOUNTER — Other Ambulatory Visit (HOSPITAL_BASED_OUTPATIENT_CLINIC_OR_DEPARTMENT_OTHER): Payer: Self-pay

## 2023-12-04 ENCOUNTER — Other Ambulatory Visit (HOSPITAL_COMMUNITY): Payer: Self-pay

## 2023-12-04 MED ORDER — AMPHETAMINE-DEXTROAMPHETAMINE 15 MG PO TABS
1.0000 | ORAL_TABLET | Freq: Every day | ORAL | 0 refills | Status: DC
  Filled 2023-12-04: qty 30, 30d supply, fill #0

## 2023-12-04 MED ORDER — MINOXIDIL 2.5 MG PO TABS
2.5000 mg | ORAL_TABLET | Freq: Every day | ORAL | 10 refills | Status: AC
  Filled 2023-12-04: qty 30, 30d supply, fill #0
  Filled 2024-01-11: qty 30, 30d supply, fill #1
  Filled 2024-02-05: qty 30, 30d supply, fill #2
  Filled 2024-03-06: qty 30, 30d supply, fill #3
  Filled 2024-04-03 (×4): qty 30, 30d supply, fill #4
  Filled 2024-05-06: qty 30, 30d supply, fill #5
  Filled 2024-06-06: qty 30, 30d supply, fill #6
  Filled 2024-09-02: qty 30, 30d supply, fill #7

## 2023-12-15 ENCOUNTER — Other Ambulatory Visit (HOSPITAL_COMMUNITY): Payer: Self-pay

## 2023-12-15 MED ORDER — ONDANSETRON HCL 4 MG PO TABS
4.0000 mg | ORAL_TABLET | Freq: Three times a day (TID) | ORAL | 0 refills | Status: DC | PRN
  Filled 2023-12-15: qty 8, 3d supply, fill #0

## 2023-12-15 MED ORDER — OXYCODONE HCL 5 MG PO TABS
5.0000 mg | ORAL_TABLET | ORAL | 0 refills | Status: DC | PRN
Start: 2023-12-15 — End: 2024-06-24
  Filled 2023-12-15: qty 12, 2d supply, fill #0

## 2023-12-15 MED ORDER — METHYLPREDNISOLONE 4 MG PO TBPK
ORAL_TABLET | ORAL | 0 refills | Status: DC
Start: 2023-12-15 — End: 2024-06-24
  Filled 2023-12-15: qty 21, 6d supply, fill #0

## 2023-12-15 MED ORDER — DOCUSATE CALCIUM 240 MG PO CAPS
240.0000 mg | ORAL_CAPSULE | Freq: Every day | ORAL | 0 refills | Status: DC | PRN
  Filled 2023-12-15: qty 6, 3d supply, fill #0

## 2023-12-15 MED ORDER — ACETAMINOPHEN 325 MG PO TABS
650.0000 mg | ORAL_TABLET | Freq: Four times a day (QID) | ORAL | 0 refills | Status: DC | PRN
  Filled 2023-12-15: qty 40, 5d supply, fill #0

## 2023-12-19 ENCOUNTER — Other Ambulatory Visit (HOSPITAL_COMMUNITY): Payer: Self-pay

## 2023-12-21 ENCOUNTER — Other Ambulatory Visit (HOSPITAL_COMMUNITY): Payer: Self-pay

## 2023-12-22 ENCOUNTER — Other Ambulatory Visit (HOSPITAL_COMMUNITY): Payer: Self-pay

## 2023-12-25 ENCOUNTER — Other Ambulatory Visit (HOSPITAL_COMMUNITY): Payer: Self-pay

## 2023-12-25 MED ORDER — ZOLPIDEM TARTRATE 10 MG PO TABS
10.0000 mg | ORAL_TABLET | Freq: Every day | ORAL | 3 refills | Status: DC
  Filled 2023-12-25: qty 30, 30d supply, fill #0
  Filled 2024-01-22: qty 30, 30d supply, fill #1
  Filled 2024-04-03 (×2): qty 30, 30d supply, fill #2
  Filled 2024-05-15: qty 30, 30d supply, fill #3

## 2023-12-28 ENCOUNTER — Other Ambulatory Visit (HOSPITAL_COMMUNITY): Payer: Self-pay

## 2024-01-07 ENCOUNTER — Other Ambulatory Visit (HOSPITAL_COMMUNITY): Payer: Self-pay

## 2024-01-08 ENCOUNTER — Other Ambulatory Visit (HOSPITAL_COMMUNITY): Payer: Self-pay

## 2024-01-08 MED ORDER — CLONAZEPAM 1 MG PO TABS
1.0000 mg | ORAL_TABLET | Freq: Three times a day (TID) | ORAL | 3 refills | Status: DC
  Filled 2024-01-08: qty 90, 30d supply, fill #0
  Filled 2024-02-05: qty 90, 30d supply, fill #1
  Filled 2024-03-08: qty 90, 30d supply, fill #2
  Filled 2024-04-05: qty 90, 30d supply, fill #3
  Filled ????-??-??: fill #3

## 2024-01-11 ENCOUNTER — Other Ambulatory Visit: Payer: Self-pay

## 2024-01-11 ENCOUNTER — Other Ambulatory Visit (HOSPITAL_COMMUNITY): Payer: Self-pay

## 2024-01-22 ENCOUNTER — Other Ambulatory Visit (HOSPITAL_COMMUNITY): Payer: Self-pay

## 2024-02-05 ENCOUNTER — Other Ambulatory Visit: Payer: Self-pay

## 2024-02-14 ENCOUNTER — Other Ambulatory Visit (HOSPITAL_COMMUNITY): Payer: Self-pay

## 2024-02-14 MED ORDER — AMPHETAMINE-DEXTROAMPHETAMINE 15 MG PO TABS
1.0000 | ORAL_TABLET | Freq: Every day | ORAL | 0 refills | Status: DC
  Filled 2024-02-14: qty 30, 30d supply, fill #0

## 2024-02-14 MED ORDER — BUPROPION HCL ER (XL) 150 MG PO TB24
150.0000 mg | ORAL_TABLET | Freq: Every day | ORAL | 3 refills | Status: AC
  Filled 2024-02-14: qty 30, 30d supply, fill #0
  Filled 2024-03-12: qty 30, 30d supply, fill #1
  Filled 2024-04-23: qty 30, 30d supply, fill #2
  Filled 2024-05-26: qty 30, 30d supply, fill #3
  Filled 2024-07-10: qty 30, 30d supply, fill #4
  Filled 2024-09-26: qty 30, 30d supply, fill #5

## 2024-03-08 ENCOUNTER — Other Ambulatory Visit: Payer: Self-pay

## 2024-03-08 ENCOUNTER — Other Ambulatory Visit (HOSPITAL_COMMUNITY): Payer: Self-pay

## 2024-03-08 MED ORDER — RIZATRIPTAN BENZOATE 10 MG PO TABS
10.0000 mg | ORAL_TABLET | Freq: Every day | ORAL | 1 refills | Status: DC | PRN
  Filled 2024-03-08: qty 18, 30d supply, fill #0
  Filled 2024-05-26: qty 18, 30d supply, fill #1

## 2024-03-19 ENCOUNTER — Other Ambulatory Visit (HOSPITAL_COMMUNITY): Payer: Self-pay

## 2024-03-19 MED ORDER — AMPHETAMINE-DEXTROAMPHETAMINE 15 MG PO TABS
1.0000 | ORAL_TABLET | Freq: Every day | ORAL | 0 refills | Status: DC
  Filled 2024-03-19: qty 30, 30d supply, fill #0

## 2024-04-01 ENCOUNTER — Other Ambulatory Visit (HOSPITAL_COMMUNITY): Payer: Self-pay

## 2024-04-01 MED ORDER — ZEPBOUND 2.5 MG/0.5ML ~~LOC~~ SOAJ
2.5000 mg | SUBCUTANEOUS | 1 refills | Status: DC
  Filled 2024-04-01: qty 2, 28d supply, fill #0

## 2024-04-03 ENCOUNTER — Other Ambulatory Visit (HOSPITAL_COMMUNITY): Payer: Self-pay

## 2024-04-03 ENCOUNTER — Other Ambulatory Visit: Payer: Self-pay

## 2024-04-05 ENCOUNTER — Other Ambulatory Visit: Payer: Self-pay

## 2024-04-23 ENCOUNTER — Other Ambulatory Visit (HOSPITAL_COMMUNITY): Payer: Self-pay

## 2024-04-24 ENCOUNTER — Other Ambulatory Visit (HOSPITAL_COMMUNITY): Payer: Self-pay

## 2024-04-24 MED ORDER — CLONAZEPAM 1 MG PO TABS
ORAL_TABLET | ORAL | 3 refills | Status: DC
  Filled 2024-05-06: qty 90, 30d supply, fill #0
  Filled 2024-06-06: qty 90, 30d supply, fill #1
  Filled 2024-07-07: qty 90, 30d supply, fill #2
  Filled 2024-08-08: qty 90, 30d supply, fill #3

## 2024-04-24 MED ORDER — AMPHETAMINE-DEXTROAMPHETAMINE 15 MG PO TABS
1.0000 | ORAL_TABLET | Freq: Every day | ORAL | 0 refills | Status: DC
  Filled 2024-04-24: qty 30, 30d supply, fill #0

## 2024-05-07 ENCOUNTER — Other Ambulatory Visit (HOSPITAL_COMMUNITY): Payer: Self-pay

## 2024-05-07 ENCOUNTER — Other Ambulatory Visit: Payer: Self-pay

## 2024-05-16 ENCOUNTER — Other Ambulatory Visit: Payer: Self-pay

## 2024-06-06 ENCOUNTER — Ambulatory Visit: Payer: Self-pay

## 2024-06-06 ENCOUNTER — Other Ambulatory Visit (HOSPITAL_COMMUNITY): Payer: Self-pay

## 2024-06-06 NOTE — Telephone Encounter (Signed)
 Last Tdap was 08/22/18.

## 2024-06-06 NOTE — Telephone Encounter (Signed)
 FYI Only or Action Required?: FYI only for provider.  Patient was last seen in primary care on 11/22/2023 by Levora Reyes SAUNDERS, MD.  Called Nurse Triage reporting Advice Only.  Symptoms began yesterday.  Interventions attempted: Other: N/A.  Symptoms are: N/A.  Triage Disposition: Information or Advice Only Call  Patient/caregiver understands and will follow disposition?: Yes  Copied from CRM #[id number]. Topic: Appointments - Other >> Jun 06, 2024 10:08 AM Jodi Nunez wrote: Patient cut arm on rusty gate and wants to know if she's recently had a titanous shot or not. Reason for Disposition  Health information question, no triage required and triager able to answer question  Answer Assessment - Initial Assessment Questions Pt states she would like to know when last tetanus shot was. She injured self on rusty metal recently. RN informed her that in the chart her last tetanus was in 2016. Advised that technically they are good for 10 years, but since she has had injury and it's been over 5 years, we recommend an UTD tetanus. Pt agreeable and states she has scheduled appointment at [facility] to receive tetanus shot this afternoon.   1. REASON FOR CALL: What is the main reason for your call? or How can I best help you?     Last tetanus shot 2. SYMPTOMS : Do you have any symptoms?      denies 3. OTHER QUESTIONS: Do you have any other questions?     denies  Protocols used: Information Only Call - No Triage-A-AH

## 2024-06-06 NOTE — Telephone Encounter (Signed)
 When I called patient she said that she just got her tetnus shot because she was told her last one was in 2016. Patient is doing well. No other concerns. Will see us  next month. Sending to PCP as FYI

## 2024-06-07 ENCOUNTER — Other Ambulatory Visit (HOSPITAL_COMMUNITY): Payer: Self-pay

## 2024-06-07 ENCOUNTER — Other Ambulatory Visit: Payer: Self-pay

## 2024-06-14 ENCOUNTER — Other Ambulatory Visit (HOSPITAL_COMMUNITY): Payer: Self-pay

## 2024-06-14 MED ORDER — ZOLPIDEM TARTRATE 10 MG PO TABS
10.0000 mg | ORAL_TABLET | Freq: Every day | ORAL | 0 refills | Status: DC
  Filled 2024-06-14: qty 30, 30d supply, fill #0

## 2024-06-14 MED ORDER — AMPHETAMINE-DEXTROAMPHETAMINE 15 MG PO TABS
1.0000 | ORAL_TABLET | Freq: Every day | ORAL | 0 refills | Status: DC
  Filled 2024-06-14: qty 30, 30d supply, fill #0

## 2024-06-24 ENCOUNTER — Ambulatory Visit: Admitting: Family Medicine

## 2024-06-24 VITALS — BP 126/80 | HR 80 | Temp 98.1°F | Resp 15 | Ht 62.25 in | Wt 147.2 lb

## 2024-06-24 DIAGNOSIS — Z0000 Encounter for general adult medical examination without abnormal findings: Secondary | ICD-10-CM | POA: Diagnosis not present

## 2024-06-24 DIAGNOSIS — Z23 Encounter for immunization: Secondary | ICD-10-CM

## 2024-06-24 NOTE — Patient Instructions (Addendum)
 Thank you for coming in today. I will request pap test results and labs from GYN and [facility] MD, then can decide if other testing needed.  Take care!   Preventive Care 48-61 Years Old, Female Preventive care refers to lifestyle choices and visits with your health care provider that can promote health and wellness. Preventive care visits are also called wellness exams. What can I expect for my preventive care visit? Counseling Your health care provider may ask you questions about your: Medical history, including: Past medical problems. Family medical history. Pregnancy history. Current health, including: Menstrual cycle. Method of birth control. Emotional well-being. Home life and relationship well-being. Sexual activity and sexual health. Lifestyle, including: Alcohol, nicotine or tobacco, and drug use. Access to firearms. Diet, exercise, and sleep habits. Work and work astronomer. Sunscreen use. Safety issues such as seatbelt and bike helmet use. Physical exam Your health care provider will check your: Height and weight. These may be used to calculate your BMI (body mass index). BMI is a measurement that tells if you are at a healthy weight. Waist circumference. This measures the distance around your waistline. This measurement also tells if you are at a healthy weight and may help predict your risk of certain diseases, such as type 2 diabetes and high blood pressure. Heart rate and blood pressure. Body temperature. Skin for abnormal spots. What immunizations do I need?  Vaccines are usually given at various ages, according to a schedule. Your health care provider will recommend vaccines for you based on your age, medical history, and lifestyle or other factors, such as travel or where you work. What tests do I need? Screening Your health care provider may recommend screening tests for certain conditions. This may include: Lipid and cholesterol levels. Diabetes screening.  This is done by checking your blood sugar (glucose) after you have not eaten for a while (fasting). Pelvic exam and Pap test. Hepatitis B test. Hepatitis C test. HIV (human immunodeficiency virus) test. STI (sexually transmitted infection) testing, if you are at risk. Lung cancer screening. Colorectal cancer screening. Mammogram. Talk with your health care provider about when you should start having regular mammograms. This may depend on whether you have a family history of breast cancer. BRCA-related cancer screening. This may be done if you have a family history of breast, ovarian, tubal, or peritoneal cancers. Bone density scan. This is done to screen for osteoporosis. Talk with your health care provider about your test results, treatment options, and if necessary, the need for more tests. Follow these instructions at home: Eating and drinking  Eat a diet that includes fresh fruits and vegetables, whole grains, lean protein, and low-fat dairy products. Take vitamin and mineral supplements as recommended by your health care provider. Do not drink alcohol if: Your health care provider tells you not to drink. You are pregnant, may be pregnant, or are planning to become pregnant. If you drink alcohol: Limit how much you have to 0-1 drink a day. Know how much alcohol is in your drink. In the [place] one drink equals one 12 oz bottle of beer (355 mL), one 5 oz glass of wine (148 mL), or one 1 oz glass of hard liquor (44 mL). Lifestyle Brush your teeth every morning and night with fluoride toothpaste. Floss one time each day. Exercise for at least 30 minutes 5 or more days each week. Do not use any products that contain nicotine or tobacco. These products include cigarettes, chewing tobacco, and vaping devices, such as  e-cigarettes. If you need help quitting, ask your health care provider. Do not use drugs. If you are sexually active, practice safe sex. Use a condom or other form of protection  to prevent STIs. If you do not wish to become pregnant, use a form of birth control. If you plan to become pregnant, see your health care provider for a prepregnancy visit. Take aspirin only as told by your health care provider. Make sure that you understand how much to take and what form to take. Work with your health care provider to find out whether it is safe and beneficial for you to take aspirin daily. Find healthy ways to manage stress, such as: Meditation, yoga, or listening to music. Journaling. Talking to a trusted person. Spending time with friends and family. Minimize exposure to UV radiation to reduce your risk of skin cancer. Safety Always wear your seat belt while driving or riding in a vehicle. Do not drive: If you have been drinking alcohol. Do not ride with someone who has been drinking. When you are tired or distracted. While texting. If you have been using any mind-altering substances or drugs. Wear a helmet and other protective equipment during sports activities. If you have firearms in your house, make sure you follow all gun safety procedures. Seek help if you have been physically or sexually abused. What's next? Visit your health care provider once a year for an annual wellness visit. Ask your health care provider how often you should have your eyes and teeth checked. Stay up to date on all vaccines. This information is not intended to replace advice given to you by your health care provider. Make sure you discuss any questions you have with your health care provider. Document Revised: 02/03/2021 Document Reviewed: 02/03/2021 Elsevier Patient Education  2024 [employer].

## 2024-06-24 NOTE — Progress Notes (Unsigned)
 Subjective:  Patient ID: Jodi Arloa GAILS., female    DOB: [birth year]  Age: 63 y.o. MRN: [id number]  CC:  Chief Complaint  Patient presents with   Annual Exam    Pt is doing okay, discuss tetanus vaccine due to recent tetanus vaccine     HPI Jodi Arloa GAILS. presents for Annual Exam  Last visit prior to plastic surgery in May. No new health issues. Still at [facility] with [facility].   PCP, [facility], Dr. Mat, treated with estradiol , Klonopin , Wellbutrin , Adderall, Maxalt , Ambien , and progesterone .  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 Has seen sleep specialist in past.  Maxalt  as needed for clusters of migraine HA - maxalt  - 4 in past few weeks, then months without having to take.  Dermatology, Dr. 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  Treated with spironolactone , minoxidil , finasteride .  Ortho, Dr. Fidel, right and left knee arthritis, visit in September 2024. Minimal relief with injections. Rolfing helped better.  Recently back at [facility] - treated with testosterone  pellets.  No current thyroid  meds. Having some weight gain - past year or so. Symptoms of menopause. Had bloodwork with [facility] recently.  Had labs at GYN as well.  Will defer labs as had done by other providers above.      06/24/2024    3:23 PM 11/22/2023    9:51 AM 03/05/2021   11:49 AM 06/29/2020    9:09 AM 05/01/2018   11:58 AM  Depression screen PHQ 2/9  Decreased Interest 1 0 0 0 0  Down, Depressed, Hopeless 1 0 0 0 0  PHQ - 2 Score 2 0 0 0 0  Altered sleeping 1 3     Tired, decreased energy 1 2     Change in appetite 0 1     Feeling bad or failure about yourself  1 0     Trouble concentrating 0 0     Moving slowly or fidgety/restless 0 0     Suicidal thoughts 0 0     PHQ-9 Score 5 6     Difficult doing  work/chores Not difficult at all        Health Maintenance  Topic Date Due   Pneumococcal Vaccine: 50+ Years (1 of 1 - PCV) Never done   Cervical Cancer Screening (HPV/Pap Cotest)  04/05/2018   Zoster Vaccines- Shingrix (2 of 2) 08/24/2020   Influenza Vaccine  03/22/2024   COVID-19 Vaccine (5 - 2025-26 season) 04/22/2024   Colonoscopy  11/09/2027   DTaP/Tdap/Td (3 - Td or Tdap) 06/06/2034   Hepatitis C Screening  Completed   HIV Screening  Completed   Hepatitis B Vaccines 19-59 Average Risk  Aged Out   HPV VACCINES  Aged Out   Meningococcal B Vaccine  Aged Out   Mammogram  Discontinued  Colonoscopy at age 16. Scheduled app[facility] in [place] - planning on colonoscopy with that visit.  S/p bilateral mastectomy. Only MRI if needed.  Pap testing at GYN - Dr. Mat, pap normal earlier this year. Yearly pap.   Immunization History  Administered Date(s) Administered   Hep B, Unspecified 01/12/2017   Influenza Whole 05/15/2017   Influenza-Unspecified 06/05/2016, 06/02/2020   MMR 08/23/1965   PFIZER(Purple Top)SARS-COV-2 Vaccination 08/12/2019, 08/30/2019, 06/19/2020   Tdap 04/03/2015, 06/06/2024   Unspecified SARS-COV-2 Vaccination 08/23/2020  Varicella 08/[birth year]1967   Zoster Recombinant(Shingrix) 06/29/2020  Second shingrix recommended - 2nd dose today. Flu vaccine at work 1 week ago.  Prevnar 20 today.  Declines covid booster.   No results found. Optho GLENWOOD Daniels at [facility] - within 2 years.   Dental- every 8 months.   Alcohol: 1-2   Tobacco: none.   Exercise: physical activity with work, less outside of work.    History Patient Active Problem List   Diagnosis Date Noted   Migraines 06/26/2017   Cellulitis of left arm 06/25/2017   Insomnia 01/29/2017   Past Medical History:  Diagnosis Date   Allergy    Breast cancer (HCC) 2006   ER-/PR-/her2+   Menopausal and perimenopausal disorder    Migraines    Past Surgical History:  Procedure  Laterality Date   BREAST SURGERY     CESAREAN SECTION     lymph node resection     MASTECTOMY     Allergies  Allergen Reactions   Penicillins Anaphylaxis    Has patient had a PCN reaction causing immediate rash, facial/tongue/throat swelling, SOB or lightheadedness with hypotension: Yes Has patient had a PCN reaction causing severe rash involving mucus membranes or skin necrosis: No Has patient had a PCN reaction that required hospitalization No Has patient had a PCN reaction occurring within the last 10 years: Yes If all of the above answers are NO, then may proceed with Cephalosporin use.    Prior to Admission medications   Medication Sig Start Date End Date Taking? Authorizing Provider  amphetamine -dextroamphetamine  (ADDERALL) 15 MG tablet Take 1 tablet by mouth daily. 06/14/24  Yes   buPROPion  (WELLBUTRIN  XL) 150 MG 24 hr tablet Take 1 tablet (150 mg total) by mouth daily. 02/14/24  Yes   Cholecalciferol (VITAMIN D PO) Take 1 tablet by mouth daily.    Yes [provider]  clonazePAM  (KLONOPIN ) 1 MG tablet Take 1 tablet (1 mg total) by mouth 3 (three) times daily. 04/24/24  Yes   estradiol  (ESTRACE ) 0.1 MG/GM vaginal cream Insert 1/2 gram vaginally 3 times a week as directed. 07/31/23  Yes   finasteride  (PROSCAR ) 5 MG tablet Take 1 tablet (5 mg total) by mouth daily. 09/18/23  Yes   ibuprofen  (ADVIL ,MOTRIN ) 200 MG tablet Take 600 mg by mouth every 6 (six) hours as needed for moderate pain.   Yes [provider]  minoxidil  (LONITEN ) 2.5 MG tablet Take 1 tablet (2.5 mg total) by mouth daily. 12/04/23  Yes   Multiple Vitamin (MULITIVITAMIN WITH MINERALS) TABS Take 1 tablet by mouth daily.   Yes [provider]  rizatriptan  (MAXALT ) 10 MG tablet Take 1 tablet (10 mg total) by mouth daily as needed for migraine headache ** Do not exceed 3 tablets in 24 hours ** 03/08/24  Yes   spironolactone  (ALDACTONE ) 50 MG tablet Take 1 tablet (50 mg total) by mouth daily. 09/18/23   Yes   zolpidem  (AMBIEN ) 10 MG tablet Take 1 tablet (10 mg total) by mouth daily. 06/14/24  Yes    Social History   Socioeconomic History   Marital status: Married    Spouse name: Not on file   Number of children: Not on file   Years of education: Not on file   Highest education level: Bachelor's degree (e.g., BA, AB, BS)  Occupational History   Not on file  Tobacco Use   Smoking status: Former    Current packs/day: 0.00    Average packs/day: 0.2 packs/day for 10.0 years (2.0  ttl pk-yrs)    Types: Cigarettes    Start date: 01/26/1987    Quit date: 01/25/1997    Years since quitting: 27.4   Smokeless tobacco: Never  Substance and Sexual Activity   Alcohol use: Yes    Comment: occasional   Drug use: No   Sexual activity: Not on file  Other Topics Concern   Not on file  Social History Narrative   Not on file   Social Drivers of Health   Financial Resource Strain: Low Risk  (06/20/2024)   Overall Financial Resource Strain (CARDIA)    Difficulty of Paying Living Expenses: Not hard at all  Food Insecurity: No Food Insecurity (06/20/2024)   Hunger Vital Sign    Worried About Running Out of Food in the Last Year: Never true    Ran Out of Food in the Last Year: Never true  Transportation Needs: No Transportation Needs (06/20/2024)   PRAPARE - Administrator, Civil Service (Medical): No    Lack of Transportation (Non-Medical): No  Physical Activity: Insufficiently Active (06/20/2024)   Exercise Vital Sign    Days of Exercise per Week: 3 days    Minutes of Exercise per Session: 30 min  Stress: Stress Concern Present (06/20/2024)   [employer] of Occupational Health - Occupational Stress Questionnaire    Feeling of Stress: Rather much  Social Connections: Moderately Integrated (06/20/2024)   Social Connection and Isolation Panel    Frequency of Communication with Friends and Family: More than three times a week    Frequency of Social Gatherings with Friends and  Family: Once a week    Attends Religious Services: 1 to 4 times per year    Active Member of [employer] or Organizations: No    Attends Engineer, Structural: Not on file    Marital Status: Married  Catering Manager Violence: Not on file    Review of Systems 13 point review of systems per patient health survey noted.  Negative other than as indicated above or in HPI.    Objective:  There were no vitals filed for this visit. {Vitals History (Optional):23777}  Physical Exam Vitals reviewed.  Constitutional:      Appearance: She is well-developed.  HENT:     Head: Normocephalic and atraumatic.     Right Ear: External ear normal.     Left Ear: External ear normal.  Eyes:     Conjunctiva/sclera: Conjunctivae normal.     Pupils: Pupils are equal, round, and reactive to light.  Neck:     Thyroid : No thyromegaly.  Cardiovascular:     Rate and Rhythm: Normal rate and regular rhythm.     Heart sounds: Normal heart sounds. No murmur heard. Pulmonary:     Effort: Pulmonary effort is normal. No respiratory distress.     Breath sounds: Normal breath sounds. No wheezing.  Abdominal:     General: Bowel sounds are normal.     Palpations: Abdomen is soft.     Tenderness: There is no abdominal tenderness.  Musculoskeletal:        General: No tenderness. Normal range of motion.     Cervical back: Normal range of motion and neck supple.  Lymphadenopathy:     Cervical: No cervical adenopathy.  Skin:    General: Skin is warm and dry.     Findings: No rash.  Neurological:     Mental Status: She is alert and oriented to person, place, and time.  Psychiatric:  Behavior: Behavior normal.        Thought Content: Thought content normal.    Assessment & Plan:  Emrys Mceachron. is a 62 y.o. female . Annual physical exam  - -anticipatory guidance as below in AVS, screening labs deferred with other providers check-in labs above - will request labs.  Health maintenance items as above  in HPI discussed/recommended as applicable.   Need for pneumococcal vaccination - Plan: Pneumococcal conjugate vaccine 20-valent (Prevnar 20)  - prevnar given  Need for shingles vaccine - Plan: Varicella-zoster vaccine IM  - 2nd dose shingrix given.   No orders of the defined types were placed in this encounter.  Patient Instructions  Thank you for coming in today. Please send me your labs from your other specialists if possible and then can decide if additional testing needed. Keep follow up with other specialists if possible.  Take care!   Preventive Care 43-61 Years Old, Female Preventive care refers to lifestyle choices and visits with your health care provider that can promote health and wellness. Preventive care visits are also called wellness exams. What can I expect for my preventive care visit? Counseling Your health care provider may ask you questions about your: Medical history, including: Past medical problems. Family medical history. Pregnancy history. Current health, including: Menstrual cycle. Method of birth control. Emotional well-being. Home life and relationship well-being. Sexual activity and sexual health. Lifestyle, including: Alcohol, nicotine or tobacco, and drug use. Access to firearms. Diet, exercise, and sleep habits. Work and work astronomer. Sunscreen use. Safety issues such as seatbelt and bike helmet use. Physical exam Your health care provider will check your: Height and weight. These may be used to calculate your BMI (body mass index). BMI is a measurement that tells if you are at a healthy weight. Waist circumference. This measures the distance around your waistline. This measurement also tells if you are at a healthy weight and may help predict your risk of certain diseases, such as type 2 diabetes and high blood pressure. Heart rate and blood pressure. Body temperature. Skin for abnormal spots. What immunizations do I need?  Vaccines  are usually given at various ages, according to a schedule. Your health care provider will recommend vaccines for you based on your age, medical history, and lifestyle or other factors, such as travel or where you work. What tests do I need? Screening Your health care provider may recommend screening tests for certain conditions. This may include: Lipid and cholesterol levels. Diabetes screening. This is done by checking your blood sugar (glucose) after you have not eaten for a while (fasting). Pelvic exam and Pap test. Hepatitis B test. Hepatitis C test. HIV (human immunodeficiency virus) test. STI (sexually transmitted infection) testing, if you are at risk. Lung cancer screening. Colorectal cancer screening. Mammogram. Talk with your health care provider about when you should start having regular mammograms. This may depend on whether you have a family history of breast cancer. BRCA-related cancer screening. This may be done if you have a family history of breast, ovarian, tubal, or peritoneal cancers. Bone density scan. This is done to screen for osteoporosis. Talk with your health care provider about your test results, treatment options, and if necessary, the need for more tests. Follow these instructions at home: Eating and drinking  Eat a diet that includes fresh fruits and vegetables, whole grains, lean protein, and low-fat dairy products. Take vitamin and mineral supplements as recommended by your health care provider. Do not drink alcohol if:  Your health care provider tells you not to drink. You are pregnant, may be pregnant, or are planning to become pregnant. If you drink alcohol: Limit how much you have to 0-1 drink a day. Know how much alcohol is in your drink. In the [place] one drink equals one 12 oz bottle of beer (355 mL), one 5 oz glass of wine (148 mL), or one 1 oz glass of hard liquor (44 mL). Lifestyle Brush your teeth every morning and night with fluoride  toothpaste. Floss one time each day. Exercise for at least 30 minutes 5 or more days each week. Do not use any products that contain nicotine or tobacco. These products include cigarettes, chewing tobacco, and vaping devices, such as e-cigarettes. If you need help quitting, ask your health care provider. Do not use drugs. If you are sexually active, practice safe sex. Use a condom or other form of protection to prevent STIs. If you do not wish to become pregnant, use a form of birth control. If you plan to become pregnant, see your health care provider for a prepregnancy visit. Take aspirin only as told by your health care provider. Make sure that you understand how much to take and what form to take. Work with your health care provider to find out whether it is safe and beneficial for you to take aspirin daily. Find healthy ways to manage stress, such as: Meditation, yoga, or listening to music. Journaling. Talking to a trusted person. Spending time with friends and family. Minimize exposure to UV radiation to reduce your risk of skin cancer. Safety Always wear your seat belt while driving or riding in a vehicle. Do not drive: If you have been drinking alcohol. Do not ride with someone who has been drinking. When you are tired or distracted. While texting. If you have been using any mind-altering substances or drugs. Wear a helmet and other protective equipment during sports activities. If you have firearms in your house, make sure you follow all gun safety procedures. Seek help if you have been physically or sexually abused. What's next? Visit your health care provider once a year for an annual wellness visit. Ask your health care provider how often you should have your eyes and teeth checked. Stay up to date on all vaccines. This information is not intended to replace advice given to you by your health care provider. Make sure you discuss any questions you have with your health care  provider. Document Revised: 02/03/2021 Document Reviewed: 02/03/2021 Elsevier Patient Education  2024 [employer]      Signed,   Reyes Pines, MD [facility], [facility] 06/24/24 4:20 PM

## 2024-06-25 ENCOUNTER — Encounter: Payer: Self-pay | Admitting: Family Medicine

## 2024-07-03 ENCOUNTER — Other Ambulatory Visit (HOSPITAL_COMMUNITY): Payer: Self-pay

## 2024-07-03 MED ORDER — LINZESS 145 MCG PO CAPS
145.0000 ug | ORAL_CAPSULE | Freq: Every day | ORAL | 3 refills | Status: AC
  Filled 2024-07-03: qty 30, 30d supply, fill #0

## 2024-07-03 MED ORDER — NA SULFATE-K SULFATE-MG SULF 17.5-3.13-1.6 GM/177ML PO SOLN
ORAL | 0 refills | Status: AC
  Filled 2024-07-03: qty 354, 1d supply, fill #0

## 2024-07-03 MED ORDER — PANTOPRAZOLE SODIUM 40 MG PO TBEC
40.0000 mg | DELAYED_RELEASE_TABLET | Freq: Every day | ORAL | 3 refills | Status: AC
  Filled 2024-07-03: qty 30, 30d supply, fill #0
  Filled 2024-09-02: qty 30, 30d supply, fill #1
  Filled 2024-09-26 – 2024-09-27 (×2): qty 30, 30d supply, fill #2

## 2024-07-05 ENCOUNTER — Other Ambulatory Visit (HOSPITAL_COMMUNITY): Payer: Self-pay

## 2024-07-05 MED ORDER — LUBIPROSTONE 24 MCG PO CAPS
ORAL_CAPSULE | ORAL | 3 refills | Status: AC
  Filled 2024-07-05: qty 60, 30d supply, fill #0

## 2024-07-08 ENCOUNTER — Other Ambulatory Visit: Payer: Self-pay

## 2024-07-08 ENCOUNTER — Other Ambulatory Visit (HOSPITAL_COMMUNITY): Payer: Self-pay

## 2024-07-16 ENCOUNTER — Other Ambulatory Visit (HOSPITAL_COMMUNITY): Payer: Self-pay

## 2024-07-20 ENCOUNTER — Other Ambulatory Visit (HOSPITAL_COMMUNITY): Payer: Self-pay

## 2024-07-20 MED ORDER — ZOLPIDEM TARTRATE 10 MG PO TABS
10.0000 mg | ORAL_TABLET | Freq: Every day | ORAL | 0 refills | Status: DC
  Filled 2024-07-20: qty 30, 30d supply, fill #0

## 2024-07-29 ENCOUNTER — Other Ambulatory Visit (HOSPITAL_COMMUNITY): Payer: Self-pay

## 2024-07-30 ENCOUNTER — Other Ambulatory Visit (HOSPITAL_COMMUNITY): Payer: Self-pay

## 2024-07-30 MED ORDER — RIZATRIPTAN BENZOATE 10 MG PO TABS
10.0000 mg | ORAL_TABLET | Freq: Every day | ORAL | 1 refills | Status: AC | PRN
  Filled 2024-07-30: qty 18, 30d supply, fill #0
  Filled 2024-09-26: qty 10, 30d supply, fill #1

## 2024-08-02 ENCOUNTER — Other Ambulatory Visit (HOSPITAL_COMMUNITY): Payer: Self-pay

## 2024-08-02 MED ORDER — AMPHETAMINE-DEXTROAMPHETAMINE 15 MG PO TABS
1.0000 | ORAL_TABLET | Freq: Every day | ORAL | 0 refills | Status: DC
  Filled 2024-08-02: qty 30, 30d supply, fill #0

## 2024-08-09 ENCOUNTER — Other Ambulatory Visit: Payer: Self-pay

## 2024-09-05 ENCOUNTER — Other Ambulatory Visit (HOSPITAL_COMMUNITY): Payer: Self-pay

## 2024-09-09 ENCOUNTER — Other Ambulatory Visit (HOSPITAL_COMMUNITY): Payer: Self-pay

## 2024-09-09 MED ORDER — AMPHETAMINE-DEXTROAMPHETAMINE 15 MG PO TABS
1.0000 | ORAL_TABLET | Freq: Every day | ORAL | 0 refills | Status: AC
  Filled 2024-09-09: qty 30, 30d supply, fill #0

## 2024-09-09 MED ORDER — CLONAZEPAM 1 MG PO TABS
ORAL_TABLET | ORAL | 3 refills | Status: AC
  Filled 2024-09-09: qty 90, 30d supply, fill #0

## 2024-09-11 ENCOUNTER — Other Ambulatory Visit (HOSPITAL_COMMUNITY): Payer: Self-pay

## 2024-09-12 ENCOUNTER — Other Ambulatory Visit (HOSPITAL_COMMUNITY): Payer: Self-pay

## 2024-09-12 MED ORDER — ZOLPIDEM TARTRATE 10 MG PO TABS
10.0000 mg | ORAL_TABLET | Freq: Every day | ORAL | 3 refills | Status: AC
  Filled 2024-09-12: qty 30, 30d supply, fill #0

## 2024-09-26 ENCOUNTER — Other Ambulatory Visit (HOSPITAL_COMMUNITY): Payer: Self-pay

## 2024-09-26 ENCOUNTER — Other Ambulatory Visit: Payer: Self-pay
